# Patient Record
Sex: Female | Born: 1950 | Race: White | Hispanic: No | Marital: Single | Smoking: Current every day smoker
Health system: Southern US, Community
[De-identification: ages and names within clinical notes are randomized; demographics above are authoritative.]

## PROBLEM LIST (undated history)

## (undated) DIAGNOSIS — I10 Essential (primary) hypertension: Secondary | ICD-10-CM

## (undated) DIAGNOSIS — C539 Malignant neoplasm of cervix uteri, unspecified: Secondary | ICD-10-CM

## (undated) DIAGNOSIS — J449 Chronic obstructive pulmonary disease, unspecified: Secondary | ICD-10-CM

## (undated) DIAGNOSIS — I4891 Unspecified atrial fibrillation: Secondary | ICD-10-CM

## (undated) DIAGNOSIS — I509 Heart failure, unspecified: Secondary | ICD-10-CM

## (undated) DIAGNOSIS — E785 Hyperlipidemia, unspecified: Secondary | ICD-10-CM

## (undated) DIAGNOSIS — E079 Disorder of thyroid, unspecified: Secondary | ICD-10-CM

## (undated) HISTORY — DX: Hyperlipidemia, unspecified: E78.5

## (undated) HISTORY — PX: CERVICAL BIOPSY: SHX590

## (undated) HISTORY — DX: Malignant neoplasm of cervix uteri, unspecified: C53.9

## (undated) HISTORY — DX: Chronic obstructive pulmonary disease, unspecified: J44.9

## (undated) HISTORY — DX: Heart failure, unspecified: I50.9

## (undated) HISTORY — DX: Disorder of thyroid, unspecified: E07.9

## (undated) HISTORY — DX: Unspecified atrial fibrillation: I48.91

## (undated) HISTORY — PX: CORNEAL TRANSPLANT: SHX108

---

## 2005-08-07 ENCOUNTER — Ambulatory Visit: Payer: Self-pay | Admitting: Ophthalmology

## 2005-11-20 ENCOUNTER — Ambulatory Visit: Payer: Self-pay | Admitting: Ophthalmology

## 2007-11-20 ENCOUNTER — Ambulatory Visit: Payer: Self-pay | Admitting: Emergency Medicine

## 2013-02-11 ENCOUNTER — Inpatient Hospital Stay: Payer: Self-pay | Admitting: Internal Medicine

## 2013-02-11 LAB — CBC
HCT: 37.9 % (ref 35.0–47.0)
HGB: 12.5 g/dL (ref 12.0–16.0)
MCH: 25.8 pg — ABNORMAL LOW (ref 26.0–34.0)
MCHC: 33 g/dL (ref 32.0–36.0)
Platelet: 153 10*3/uL (ref 150–440)
RBC: 4.85 10*6/uL (ref 3.80–5.20)
RDW: 16.1 % — ABNORMAL HIGH (ref 11.5–14.5)
WBC: 9.2 10*3/uL (ref 3.6–11.0)

## 2013-02-11 LAB — TROPONIN I: Troponin-I: 0.04 ng/mL

## 2013-02-11 LAB — COMPREHENSIVE METABOLIC PANEL
Anion Gap: 8 (ref 7–16)
BUN: 16 mg/dL (ref 7–18)
Co2: 26 mmol/L (ref 21–32)
Creatinine: 0.34 mg/dL — ABNORMAL LOW (ref 0.60–1.30)
Potassium: 4.1 mmol/L (ref 3.5–5.1)
SGPT (ALT): 27 U/L (ref 12–78)
Sodium: 141 mmol/L (ref 136–145)
Total Protein: 6.6 g/dL (ref 6.4–8.2)

## 2013-02-11 LAB — CK TOTAL AND CKMB (NOT AT ARMC)
CK, Total: 62 U/L (ref 21–215)
CK, Total: 54 U/L (ref 21–215)
CK-MB: 3.3 ng/mL (ref 0.5–3.6)
CK-MB: 2.3 ng/mL (ref 0.5–3.6)

## 2013-02-12 LAB — COMPREHENSIVE METABOLIC PANEL
Albumin: 3 g/dL — ABNORMAL LOW (ref 3.4–5.0)
BUN: 19 mg/dL — ABNORMAL HIGH (ref 7–18)
Bilirubin,Total: 0.7 mg/dL (ref 0.2–1.0)
Calcium, Total: 8.7 mg/dL (ref 8.5–10.1)
Chloride: 106 mmol/L (ref 98–107)
Co2: 27 mmol/L (ref 21–32)
EGFR (African American): >60
Glucose: 165 mg/dL — ABNORMAL HIGH (ref 65–99)
SGPT (ALT): 24 U/L (ref 12–78)
Sodium: 139 mmol/L (ref 136–145)
Total Protein: 6.4 g/dL (ref 6.4–8.2)

## 2013-02-12 LAB — CBC WITH DIFFERENTIAL/PLATELET
Basophil #: 0 10*3/uL (ref 0.0–0.1)
Basophil %: 0.4 %
Eosinophil #: 0 10*3/uL (ref 0.0–0.7)
Eosinophil %: 0 %
HCT: 38.2 % (ref 35.0–47.0)
Lymphocyte #: 1.1 10*3/uL (ref 1.0–3.6)
Lymphocyte %: 32.7 %
Monocyte #: 0.1 x10 3/mm — ABNORMAL LOW (ref 0.2–0.9)
Monocyte %: 2 %
Neutrophil #: 2.3 10*3/uL (ref 1.4–6.5)
Neutrophil %: 64.9 %
Platelet: 155 10*3/uL (ref 150–440)
RDW: 16.3 % — ABNORMAL HIGH (ref 11.5–14.5)
WBC: 3.5 10*3/uL — ABNORMAL LOW (ref 3.6–11.0)

## 2013-02-12 LAB — T4, FREE: Free Thyroxine: 4.85 ng/dL — ABNORMAL HIGH (ref 0.76–1.46)

## 2013-02-12 LAB — LIPID PANEL
Ldl Cholesterol, Calc: 42 mg/dL (ref 0–100)
VLDL Cholesterol, Calc: 10 mg/dL (ref 5–40)

## 2013-02-12 LAB — CK TOTAL AND CKMB (NOT AT ARMC)
CK, Total: 42 U/L (ref 21–215)
CK-MB: 2 ng/mL (ref 0.5–3.6)

## 2013-02-12 LAB — TROPONIN I: Troponin-I: 0.02 ng/mL

## 2013-02-13 LAB — DRUG SCREEN, URINE
Benzodiazepine, Ur Scrn: NEGATIVE (ref ?–200)
Cannabinoid 50 Ng, Ur ~~LOC~~: NEGATIVE (ref ?–50)
Cocaine Metabolite,Ur ~~LOC~~: NEGATIVE (ref ?–300)
MDMA (Ecstasy)Ur Screen: NEGATIVE (ref ?–500)
Methadone, Ur Screen: NEGATIVE (ref ?–300)
Opiate, Ur Screen: NEGATIVE (ref ?–300)
Phencyclidine (PCP) Ur S: NEGATIVE (ref ?–25)
Tricyclic, Ur Screen: NEGATIVE (ref ?–1000)

## 2013-02-13 LAB — BASIC METABOLIC PANEL
Anion Gap: 7 (ref 7–16)
BUN: 32 mg/dL — ABNORMAL HIGH (ref 7–18)
Chloride: 103 mmol/L (ref 98–107)
Co2: 26 mmol/L (ref 21–32)
Creatinine: 0.46 mg/dL — ABNORMAL LOW (ref 0.60–1.30)
EGFR (African American): >60
EGFR (Non-African Amer.): >60
Sodium: 136 mmol/L (ref 136–145)

## 2013-02-13 LAB — T4, FREE: Free Thyroxine: 2.65 ng/dL — ABNORMAL HIGH (ref 0.76–1.46)

## 2013-02-16 LAB — CBC WITH DIFFERENTIAL/PLATELET
Eosinophil #: 0 10*3/uL (ref 0.0–0.7)
Eosinophil %: 0.3 %
HCT: 37.9 % (ref 35.0–47.0)
MCH: 25.5 pg — ABNORMAL LOW (ref 26.0–34.0)
MCHC: 32.4 g/dL (ref 32.0–36.0)
MCV: 79 fL — ABNORMAL LOW (ref 80–100)
Monocyte #: 1.4 x10 3/mm — ABNORMAL HIGH (ref 0.2–0.9)
Monocyte %: 11.9 %
Neutrophil #: 7.4 10*3/uL — ABNORMAL HIGH (ref 1.4–6.5)
Neutrophil %: 63.8 %
Platelet: 152 10*3/uL (ref 150–440)
RBC: 4.82 10*6/uL (ref 3.80–5.20)
WBC: 11.7 10*3/uL — ABNORMAL HIGH (ref 3.6–11.0)

## 2013-02-16 LAB — BASIC METABOLIC PANEL
BUN: 24 mg/dL — ABNORMAL HIGH (ref 7–18)
Calcium, Total: 8.5 mg/dL (ref 8.5–10.1)
Co2: 31 mmol/L (ref 21–32)
Potassium: 4.5 mmol/L (ref 3.5–5.1)
Sodium: 139 mmol/L (ref 136–145)

## 2013-02-17 LAB — CULTURE, BLOOD (SINGLE)

## 2013-03-10 ENCOUNTER — Ambulatory Visit: Payer: Self-pay

## 2013-03-12 ENCOUNTER — Ambulatory Visit: Payer: Self-pay

## 2013-12-09 ENCOUNTER — Ambulatory Visit: Payer: Self-pay

## 2013-12-14 ENCOUNTER — Ambulatory Visit: Payer: Self-pay

## 2014-07-12 ENCOUNTER — Observation Stay: Payer: Self-pay | Admitting: Internal Medicine

## 2014-07-12 LAB — CBC WITH DIFFERENTIAL/PLATELET
BASOS PCT: 0.6 %
Basophil #: 0.1 10*3/uL (ref 0.0–0.1)
EOS ABS: 0.1 10*3/uL (ref 0.0–0.7)
Eosinophil %: 1 %
HCT: 39.7 % (ref 35.0–47.0)
HGB: 13.1 g/dL (ref 12.0–16.0)
LYMPHS ABS: 2.1 10*3/uL (ref 1.0–3.6)
LYMPHS PCT: 25.5 %
MCH: 29.3 pg (ref 26.0–34.0)
MCHC: 33 g/dL (ref 32.0–36.0)
MCV: 89 fL (ref 80–100)
MONOS PCT: 12 %
Monocyte #: 1 x10 3/mm — ABNORMAL HIGH (ref 0.2–0.9)
Neutrophil #: 5.1 10*3/uL (ref 1.4–6.5)
Neutrophil %: 60.9 %
PLATELETS: 239 10*3/uL (ref 150–440)
RBC: 4.47 10*6/uL (ref 3.80–5.20)
RDW: 15.4 % — ABNORMAL HIGH (ref 11.5–14.5)
WBC: 8.3 10*3/uL (ref 3.6–11.0)

## 2014-07-12 LAB — URINALYSIS, COMPLETE
Bilirubin,UR: NEGATIVE
Blood: NEGATIVE
GLUCOSE, UR: NEGATIVE mg/dL (ref 0–75)
Hyaline Cast: 46
Ketone: NEGATIVE
LEUKOCYTE ESTERASE: NEGATIVE
NITRITE: NEGATIVE
PROTEIN: NEGATIVE
Ph: 5 (ref 4.5–8.0)
SPECIFIC GRAVITY: 1.013 (ref 1.003–1.030)
WBC UR: 2 /HPF (ref 0–5)

## 2014-07-12 LAB — BASIC METABOLIC PANEL
ANION GAP: 5 — AB (ref 7–16)
BUN: 15 mg/dL (ref 7–18)
CREATININE: 0.81 mg/dL (ref 0.60–1.30)
Calcium, Total: 9 mg/dL (ref 8.5–10.1)
Chloride: 106 mmol/L (ref 98–107)
Co2: 25 mmol/L (ref 21–32)
GLUCOSE: 97 mg/dL (ref 65–99)
Osmolality: 273 (ref 275–301)
POTASSIUM: 4.8 mmol/L (ref 3.5–5.1)
Sodium: 136 mmol/L (ref 136–145)

## 2014-07-12 LAB — TROPONIN I: Troponin-I: <0.02 ng/mL

## 2014-07-12 LAB — T4, FREE: Free Thyroxine: 3.25 ng/dL — ABNORMAL HIGH (ref 0.76–1.46)

## 2014-07-12 LAB — TSH: THYROID STIMULATING HORM: 0.041 u[IU]/mL — AB

## 2014-07-13 LAB — CBC WITH DIFFERENTIAL/PLATELET
BASOS ABS: 0 10*3/uL (ref 0.0–0.1)
Basophil %: 0.6 %
EOS PCT: 1.6 %
Eosinophil #: 0.1 10*3/uL (ref 0.0–0.7)
HCT: 34.2 % — ABNORMAL LOW (ref 35.0–47.0)
HGB: 11.7 g/dL — ABNORMAL LOW (ref 12.0–16.0)
Lymphocyte #: 2.7 10*3/uL (ref 1.0–3.6)
Lymphocyte %: 45.9 %
MCH: 30.5 pg (ref 26.0–34.0)
MCHC: 34.1 g/dL (ref 32.0–36.0)
MCV: 89 fL (ref 80–100)
MONOS PCT: 11.9 %
Monocyte #: 0.7 x10 3/mm (ref 0.2–0.9)
Neutrophil #: 2.4 10*3/uL (ref 1.4–6.5)
Neutrophil %: 40 %
Platelet: 191 10*3/uL (ref 150–440)
RBC: 3.82 10*6/uL (ref 3.80–5.20)
RDW: 15.6 % — AB (ref 11.5–14.5)
WBC: 6 10*3/uL (ref 3.6–11.0)

## 2014-07-13 LAB — BASIC METABOLIC PANEL
ANION GAP: 4 — AB (ref 7–16)
BUN: 13 mg/dL (ref 7–18)
CALCIUM: 8.4 mg/dL — AB (ref 8.5–10.1)
CO2: 25 mmol/L (ref 21–32)
CREATININE: 0.63 mg/dL (ref 0.60–1.30)
Chloride: 111 mmol/L — ABNORMAL HIGH (ref 98–107)
EGFR (Non-African Amer.): >60
GLUCOSE: 93 mg/dL (ref 65–99)
Osmolality: 279 (ref 275–301)
POTASSIUM: 4.3 mmol/L (ref 3.5–5.1)
Sodium: 140 mmol/L (ref 136–145)

## 2014-08-15 ENCOUNTER — Ambulatory Visit: Payer: Self-pay | Admitting: Family

## 2014-09-26 ENCOUNTER — Ambulatory Visit: Payer: Self-pay | Admitting: Family

## 2015-01-27 NOTE — Consult Note (Signed)
Psychiatry: Followup for this 64 year old woman with a history of schizophrenia. No new complaints today. She says that she is feeling better. She is anxious about her cats which is exactly what her parents had been worried about. No indication of any side effects from medicine. I would not try increasing the dose of the Abilify right now. Continue current dosage. Educational therapy done. No indication of acute dangerousness  Electronic Signatures: Deanna Boehlke, Madie Reno (MD)  (Signed on 10-May-14 23:09)  Authored  Last Updated: 10-May-14 23:09 by Gonzella Lex (MD)

## 2015-01-27 NOTE — Consult Note (Signed)
Chief Complaint and History:  Referring Physician Dr. Vianne Bulls   Chief Complaint Hyperthyroidism   Allergies:  Other- Explain in Comments Line: Rash  Tetracycline: Rash  Assessment/Plan:  Assessment/Plan 64 yo F admitted 02/11/13 with dyspnea and found to have rapid a.fib and COPD exacerbation in setting of low TSH and elevated free T4. No known h/o thyroid disease. No exposure to medications assoc with hyperthyroidism. No neck pain/fever. No recent weight loss. Now HR controlled on beta blockers.  On exam she has a goiter and a tremor. Additional labs include elev TRAb and nml free T3.  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  Recheck free T4 in 2 days. Long-term could consider I-131 ablation, however would prefer to control hyperthyroidism reasonably first before giving I-131 to avoid provoking storm. Advised smoking cessation.  Full consult to be dictated.   Electronic Signatures: Judi Cong (MD)  (Signed 12-May-14 16:41)  Authored: Chief Complaint and History, ALLERGIES, HOME MEDICATIONS, Assessment/Plan   Last Updated: 12-May-14 16:41 by Judi Cong (MD)

## 2015-01-27 NOTE — Discharge Summary (Signed)
PATIENT NAME:  Rhonda Barr, Rhonda Barr MR#:  [id number] DATE OF BIRTH:  [birth year]  DATE OF ADMISSION:  02/11/2013 DATE OF DISCHARGE:  02/16/2013  PRIMARY CARE PHYSICIAN: None.   DISCHARGE DIAGNOSES:  1.  Acute on chronic congestive heart failure with ejection fraction of 25% to 30%.  2.  Acute chronic obstructive pulmonary disease exacerbation.  3.  Atrial fibrillation with rapid ventricular rate.  4.  Acute pulmonary edema.  5.  Tobacco abuse.  6.  Alcohol abuse.   IMAGING STUDIES: Include a chest x-ray which showed pulmonary edema. No acute infiltrates. A 2-D echocardiogram showed severe MR and ejection fraction of 25% to 30%.   CONSULTANTS: Dr. Nehemiah Massed and Dr. Mortimer Fries.   ADMITTING HISTORY AND PHYSICAL: Please see detailed H and P dictated previously on 02/11/2013. In brief, this 64 year old female patient with tobacco abuse presented to the hospital complaining of shortness of breath. The patient was found to have atrial fibrillation with rapid ventricular rate and acute pulmonary edema and was admitted to the hospitalist service. The patient was admitted to ICU on a Cardizem drip. The patient did get hypotensive on rate control medications, had to be started on dopamine briefly. Later on was transferred to 2A on beta blocker, metoprolol and Cardizem and did well. For anticoagulation with her risk factors, cardiology suggested that patient be on aspirin, which has been continued. The patient had a 2-D echocardiogram, which showed severe mitral regurgitation and ejection fraction of 25% to 30%. The patient diuresed well with IV Lasix, later transitioned to p.o. Lasix along with fluid restriction of less than 2 liters a day. The patient on the day of discharge is saturating 94% on room air at rest and with ambulation. She was on alcohol withdrawal protocol during the hospital stay.   The patient also had tobacco, alcohol abuse. 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 The patient will  be scheduled for a stress test when she follows up with Dr. Nehemiah Massed as outpatient.   Today, the patient is saturating 96% on room air. Lungs have no crackles on exam. The patient has been treated for acute COPD exacerbation and will be on a prednisone taper after discharge.   The patient also had Graves' hyperthyroidism with low TSH and elevated free T3 and T4, for which she has been started on beta blockers and methimazole. Will follow up with Dr. Gabriel Carina of endocrinology in 1 to 2 weeks.   DISCHARGE MEDICATIONS: Include: 1.  Aspirin 81 mg oral once a day.  2.  Tylenol Extra-Strength 2 tablets oral 2 times a day as needed.  3.  Timolol ophthalmic solution 1 drop to right eye 2 times a day.  4.  Metoprolol 50 mg oral 2 times a day.  5.  Spiriva 18 mcg inhaled once a day.  6.  Cardizem 120 mg oral once a day.  7.  Methimazole 10 mg oral 2 times a day.  8.  Omeprazole 20 mg oral once a day.  9.  Advair Diskus 250/50, 1 puff inhaled 2 times a day.  10.  Ventolin HFA 90 mcg inhaled every 4 hours as needed for shortness of breath.  11.  Prednisone 60 mg tapered over 6 days.  12.  Lisinopril 2.5 mg oral once a day.  13.  Lasix 20 mg oral once a day.   DISCHARGE INSTRUCTIONS: The patient has been set up with home health for nursing and social work. For CHF, low-sodium, fluid restricted diet. Activity as tolerated. Follow up  with Dr. Gabriel Carina and Dr. Nehemiah Massed in 1 to 2 weeks. The patient is also being set up with [facility] as her primary care physician.   TIME SPENT: Time spent on day of discharge in discharge activity was 43 minutes.   ____________________________ Leia Alf Shery Waun[place], MD srs:jm D: 02/16/2013 15:09:35 ET T: 02/16/2013 16:48:56 ET JOB#: [id number]  cc: Alveta Heimlich R. Darvin Neighbours, MD, <Dictator> Corey Skains, MD A. Lavone Orn, MD [facility] Neita Carp MD ELECTRONICALLY SIGNED 02/17/2013 13:59

## 2015-01-27 NOTE — Consult Note (Signed)
PATIENT NAME:  Rhonda Barr, Rhonda Barr MR#:  [id number] DATE OF BIRTH:  09-20-1951  DATE OF CONSULTATION:  02/15/2013  REFERRING PHYSICIAN:  Epifanio Lesches, MD  CONSULTING PHYSICIAN:  A. Lavone Orn, MD  CHIEF COMPLAINT: Hyperthyroidism.   HISTORY OF PRESENT ILLNESS: This is a 64 year old female with no known past medical history who presented to the ED on May 8th with dyspnea. She was found to have rapid atrial fibrillation, hypoxemia, tobacco dependence, and low TSH and elevated free T4. She was admitted to the CCU. She was initially placed on diltiazem.  She was treated for probable COPD with inhalers and steroids.  Her heart rate improved on diltiazem. Initial TSH was less than 0.010, and free T4 was elevated at 4.85. Repeat free T4 on May 10th was 2.65, free T3 within normal limits at 3.8, and thyrotropin receptor antibody was elevated at 9.64. The patient has no known history of thyroid disease. She has been having palpitations for the last 2 months. She denies any weight loss. She does report a chronic tremor. She denies sense of heat intolerance. She denies known recent exposure to lithium or amiodarone. There has been no recent exposure to iodinated contrast dye. She denies neck pain or fevers.   PAST MEDICAL HISTORY: 1.  Tobacco dependence.  2.  Corneal transplant.   OUTPATIENT MEDICATIONS: 1.  Prednisolone 1% eye drops.  2.  Timolol 0.5% eye drops.  3.  Aspirin 81 mg daily.   FAMILY HISTORY: A sister has hypothyroidism. She has had other family members who have required thyroid surgery, details not clear.   SOCIAL HISTORY: She is a 1/2 pack per day smoker. She has been smoking for 50 years. No alcohol use. No drug use.   REVIEW OF SYSTEMS:  GENERAL: No weight loss. No fever.  HEENT: Denies recent change in vision. Denies eye pain.  HEENT: Denies a dry mouth. Denies dysphagia.  NECK: She has chronic posterior neck pain.  CARDIAC: She does have palpitations. Denies chest pain.   PULMONARY: She has dyspnea. She has some cough at times.  ABDOMEN: Denies abdominal pain. Reports good appetite.  EXTREMITIES: Denies leg swelling.  SKIN: Denies rash or recent skin changes. Denies pruritus.  ENDOCRINE: Denies heat or cold intolerance.  GENITOURINARY: Denies dysuria or hematuria.   PHYSICAL EXAMINATION: VITAL SIGNS: Height 62.9 inches, weight 122 pounds, BMI 21.7.  Temperature 97.9, pulse 116, respirations 33, blood pressure 143/76, pulse ox 92% on 2 liters nasal cannula.  GENERAL: Well-developed white female in no acute distress.  HEENT: EOMI. No proptosis. No lid lag. No stare. Oropharynx is clear. Mucous membranes moist.  NECK: Supple. Thyroid is diffusely enlarged, nontender. No palpable thyroid nodules. No thyroid bruit.  CARDIAC: Irregular, irregular. No appreciable murmur.  PULMONARY: Decreased breath sounds throughout. No rhonchi.  ABDOMEN: Diffusely soft, nontender.  EXTREMITIES: No edema is present.  NEUROLOGIC: A resting fine tremor is present.  PSYCHIATRIC: Alert and oriented, calm and cooperative.  SKIN: No rash or dermatopathy is present.   LABORATORY DATA: Glucose 143, BUN 32, creatinine 0.46, sodium 136, potassium 4.7, calcium 8.7. Urine drug screen negative.   ASSESSMENT: A 64 year old female admitted for rapid atrial fibrillation and chronic obstructive pulmonary disease exacerbation, found to have hyperthyroidism. Elevated thyrotropin receptor antibody level supports autoimmune hyperthyroidism or Graves' disease as the cause.   RECOMMENDATIONS: 1.  Start methimazole 20 mg b.i.d.  2.  Check free T4 every 2 days. I anticipate decreasing dose once her free T4 normalizes.  3.  Continue  beta blockers as you are for rate control.  4.  Long term, she may benefit from I-131 ablation, however, I would prefer to control hyperthyroidism initially with thionamide and consider ablation at a later date, if she is agreeable.  5.  Recommended smoking cessation.    Thank you for the kind request for consultation. I will follow along with you and anticipate setting up outpatient followup closer to the date of discharge.   ____________________________ A. Lavone Orn, MD ams:cb D: 02/15/2013 16:46:26 ET T: 02/15/2013 21:22:02 ET JOB#: [id number]  cc: A. Lavone Orn, MD, <Dictator> Sherlon Handing MD ELECTRONICALLY SIGNED 02/18/2013 8:20

## 2015-01-27 NOTE — Consult Note (Signed)
Brief Consult Note: Diagnosis: schizophrenia.   Patient was seen by consultant.   Consult note dictated.   Orders entered.   Comments: Psychiatry: Patient seen, chart reviewed, also spoke to patient's mother. Patient is 64 year old woman with long hx of schizophrenia who stopped treatment decades ago but has managed to function outside the hospital despite intermittant symptoms. Currently showing symptoms of disorganized and odd thinking and speech. I suggested to the patient and her family that adding antipsychotics back in a gentle way could be helpful. They agree. Order for 5mg  Abilify daily.   Parents greatest worry is that in her absence they have had the patient's home cleaned and removed 15 feral cats from the home. They are worried that pt will be angry. They ask if staff can help support their claim that the cats were a health hazard to the patient.  Electronic Signatures: Gonzella Lex (MD)  (Signed 09-May-14 18:51)  Authored: Brief Consult Note   Last Updated: 09-May-14 18:51 by Gonzella Lex (MD)

## 2015-01-27 NOTE — Consult Note (Signed)
General Aspect 64 year old female with history of tobacco abuse presented to urgent care and then to [facility] for 3 day history of shortness breath, palpitations, dizziness.   She was found to be in new onset A. fib.  She was Transferred to the unit with A. fib with RVR and is on Cardizem drip as well as by mouth Cardizem 60 mg every 6.  This a.m. her rate is in the 90s.  She does feels better.  Her cardiac enzymes have been negative.  Her TSH suggests hyperthyroidism. She denies   thyroid issues in the past.  She has smoked for 50 years but currently has cut back and is trying to quit.  She was also hypoxic on admission.  Chest x-ray showed right pleural effusion, CT of the chest/ echo is pending. Patient currently is on antibiotics. Also has noticed that she has had some lower extremity edema recently. She has had sinus issues recently has been taking decongestants over-the-counter the last several weeks, which have may also have contributed to the onset of A. fib.   Physical Exam:  GEN well developed, no acute distress   HEENT pink conjunctivae   RESP wheezing  Bilateral Expiratory normal respiratory wheezes    CARD Irregular rate and rhythm  No murmur    ABD denies tenderness  normal BS    EXTR negative edema   SKIN skin turgor decreased   NEURO cranial nerves intact, motor/sensory function intact   PSYCH alert, A+O to time, place, person   Review of Systems:  Subjective/Chief Complaint Shortness of breath, palpitations, Dizziness    Respiratory: Short of breath  Wheezing    Cardiovascular: Palpitations  Dyspnea  Edema    Review of Systems: All other systems were reviewed and found to be negative    Lab Results:  Thyroid:  09-May-14 04:00   Thyroid Stimulating Hormone  < 0.010 (0.45-4.50 (International Unit)  ----------------------- Pregnant patients have  different reference  ranges for TSH:  - - - - - - - - - -  Pregnant, first trimetser:  0.36 - 2.50 uIU/mL)  Hepatic:   09-May-14 04:00   Bilirubin, Total 0.7  Alkaline Phosphatase 122  SGPT (ALT) 24  SGOT (AST) 25  Total Protein, Serum 6.4  Albumin, Serum  3.0  Routine Chem:  09-May-14 04:00   Glucose, Serum  165  BUN  19  Creatinine (comp)  0.34  Sodium, Serum 139  Potassium, Serum 4.6  CO2, Serum 27  Calcium (Total), Serum 8.7  Osmolality (calc) 283  eGFR (African American) >60  eGFR (Non-African American) >60 (eGFR values <25m/min/1.73 m2 may be an indication of chronic kidney disease (CKD). Calculated eGFR is useful in patients with stable renal function. The eGFR calculation will not be reliable in acutely ill patients when serum creatinine is changing rapidly. It is not useful in  patients on dialysis. The eGFR calculation may not be applicable to patients at the low and high extremes of body sizes, pregnant women, and vegetarians.)  Anion Gap  6  Hemoglobin A1c ([facility]) 6.1 (The American Diabetes Association recommends that a primary goal of therapy should be <7% and that physicians should reevaluate the treatment regimen in patients with HbA1c values consistently >8%.)  Cholesterol, Serum 101  Triglycerides, Serum 51  HDL (INHOUSE) 49  VLDL Cholesterol Calculated 10  LDL Cholesterol Calculated 42 (Result(s) reported on 12 Feb 2013 at 04:56AM.)  Cardiac:  09-May-14 04:00   CK, Total 42  CPK-MB, Serum 2.0 (Result(s) reported  on 12 Feb 2013 at 04:51AM.)  Troponin I 0.02 (0.00-0.05 0.05 ng/mL or less: NEGATIVE  Repeat testing in 3-6 hrs  if clinically indicated. >0.05 ng/mL: POTENTIAL  MYOCARDIAL INJURY. Repeat  testing in 3-6 hrs if  clinically indicated. NOTE: An increase or decrease  of 30% or more on serial  testing suggests a  clinically important change)  Routine Hem:  09-May-14 04:00   WBC (CBC)  3.5  RBC (CBC) 4.91  Hemoglobin (CBC) 12.6  Hematocrit (CBC) 38.2  Platelet Count (CBC) 155  MCV  78  MCH  25.6  MCHC 32.9  RDW  16.3  Neutrophil % 64.9  Lymphocyte  % 32.7  Monocyte % 2.0  Eosinophil % 0.0  Basophil % 0.4  Neutrophil # 2.3  Lymphocyte # 1.1  Monocyte #  0.1  Eosinophil # 0.0  Basophil # 0.0 (Result(s) reported on 12 Feb 2013 at 04:35AM.)   Radiology Results:  XRay:    08-May-14 12:41, Chest Portable Single View  Chest Portable Single View   REASON FOR EXAM:    shob  COMMENTS:       PROCEDURE: DXR - DXR PORTABLE CHEST SINGLE VIEW  - Feb 11 2013 12:41PM     RESULT: The heart is enlarged. There there is evidence of a right pleural   effusion. There is pulmonary vascular congestion. Followup PA and lateral   views are recommended. Monitoring electrodes are in place.    IMPRESSION:  Findings consistent with a right pleural effusion and   cardiomegaly.    Dictation Site: 2      Verified By: Sundra Aland, M.D., MD  CT:    08-May-14 15:35, CT Chest With Contrast  CT Chest With Contrast   REASON FOR EXAM:    sob with   pleural effusion  COMMENTS:       PROCEDURE: CT  - CT CHEST WITH CONTRAST  - Feb 11 2013  3:35PM     RESULT: Indication: Shortness of breath, pleural effusion    Comparison: None    Technique: Multiple axial images of the chest are obtained with 75 mL of   Isovue-370 intravenous contrast.    Findings:    The central airways are patent. There is a small-moderate right pleural     effusion. There is a trace left pleural effusion. There is a partially   loculated component of the right pleural effusion. There is right basilar   and middle lobe compressive atelectasis. There is no pneumothorax.    There are no pathologically enlarged axillary, hilar, or mediastinal   lymph nodes.      The heart size is enlarged. There is no pericardial effusion. The   thoracic aorta is normal in caliber.  There is coronary artery   atherosclerosis of the LAD. There is enlargement of the main pulmonary   artery.    Review of bone windows demonstrates no focal lytic or sclerotic lesions.     Limited  noncontrast images of the upper abdomen were obtained. The     adrenal glands appear normal. There is a small amount of upper abdominal   ascites.    IMPRESSION:     1. Small-moderate right pleural effusion with a partially loculated   component.     2. Trace left pleural effusion.    3. Small amount of abdominal ascites.    4. Coronary artery disease.    Dictation Site: 1    Verified By: Jennette Banker, M.D., MD  Other- Explain in Comments Line: Rash  Tetracycline: Rash  Vital Signs/Nurse's Notes:  **Vital Signs.:   09-May-14 08:10  Temperature Source oral  Pulse Pulse 116  Respirations Respirations 33  Systolic BP Systolic BP 390  Diastolic BP (mmHg) Diastolic BP (mmHg) 74  Mean BP 85  Pulse Ox % Pulse Ox % 94  Oxygen Delivery 2L  Pulse Ox Heart Rate 58    Impression 1. New onset A. fib with RVR-  continue Cardizem by mouth, long-acting with short acting, as  ordered by Dr. Nehemiah Massed,  off drip.  Continue Lovenox.  Patient currently with a chads2 score of 1 primarily due to recent symptoms of heart failure.  Echocardiogram pending which will indicate  LV dysfunction or   heart failure symptoms secondary to rapid rate exacerbated by COPD, with possibly a thyroid component.   Electronic Signatures: Roderic Palau (NP)  (Signed [phone] 09:23)  Authored: General Aspect/Present Illness, History and Physical Exam, Review of System, Home Medications, Labs, Radiology, Allergies, Vital Signs/Nurse's Notes, Impression/Plan   Last Updated: 09-May-14 09:23 by Roderic Palau (NP)

## 2015-01-27 NOTE — Consult Note (Signed)
PATIENT NAME:  Rhonda Barr, Rhonda Barr MR#:  [id number] DATE OF BIRTH:  15-Jul-1951  DATE OF CONSULTATION:  02/12/2013  REFERRING PHYSICIAN:   CONSULTING PHYSICIAN:  Gonzella Lex, MD  IDENTIFYING INFORMATION AND REASON FOR CONSULTATION:  A 64 year old woman who was admitted to the hospital for what appears to probably be atrial fibrillation related to or causing symptoms of heart failure.  The consultation was because of a history of schizophrenia with request for treatment recommendations.   HISTORY OF PRESENT ILLNESS:  Information obtained from the patient and the chart.  The patient tells a fairly coherent story of recent physical symptoms which led her to go to the health department seeking treatment.  She was having trouble with shortness of breath and thought that it was an allergy so she went to the health department seeking a "allergy shot."  Examination there led to a more correct diagnosis and she was referred to the hospital.  In terms of her mental health the patient denies any acute symptoms.  She states that her mood is feeling quite good.  She says that she sleeps well at night and in fact perhaps sleeps more than is necessary.  Her appetite is fine.  She absolutely denies any suicidal ideation.  She is not aware of having any psychiatric symptoms that she will admit to.  As our conversation progressed, I was able to ask her whether she had any symptoms of hallucinations.  The patient never did give me a straight answer to that question, but some of things that she did say led me to believe that it is possible that she may have hallucinations at time.  The parents state that the patient generally functions pretty well despite her mental illness.  She lives by herself and takes care of her own bills.  She received some help from her parents and her sister, but for the most part has been reasonably independent.  The patient does not seem to be any trouble to her neighbors or the environment around her.   They say that she does exhibit psychotic symptoms from time to time and they will vary depending on the stress she is under and her current situation.  Parents speculate that worsening symptoms at the time now may be related to her medical stress.  The patient is not taking any psychiatric medicine or receiving any psychiatric treatment in the community.   PAST PSYCHIATRIC HISTORY:  The patient and parents both report that she had psychiatric symptoms many years ago back in the 1970s.  She was seen for some time by a psychiatrist and receive psychiatric medicine, but discontinued it decades ago, probably in the 61s.  Since that time she has received no psychiatric treatment, but has functioned adequately.  She has not had psychiatric hospitalizations.  No history of suicide attempts.  No history of violence to others.  Neither the patient nor her family remember specific medication she was on in the past.   SUBSTANCE ABUSE HISTORY:  No current or past history of alcohol or drug abuse.   PAST MEDICAL HISTORY:  The patient had eye surgery recently, but otherwise seems to not receive much medical care or have much of a known medical history.    MEDICATIONS:  She says she is on no medications at home.   FAMILY HISTORY:  None stated.   SOCIAL HISTORY:  The patient does receive social security disability.  She lives in a home and her parents live nearby.  The patient  takes care of her own bills and shopping.  Until recently she was driving although since her eye surgery she has cut down or discontinued that.  She was married decades ago, but has no children.   REVIEW OF SYSTEMS:  The patient does not have any psychiatric complaints currently.   MENTAL STATUS EXAMINATION:  The patient was awake and alert and quite pleasant and cooperative during the interview.  Eye contact was intermittent.  As she got agitated she would dart or glance around the room.  Her speech was normal in tone.  It became somewhat  erratic in its rate when she would get anxious.  Her affect varied from pleasant and smiling to tearful on a couple of occasions.  Mood was stated as feeling good.  Her thoughts were scattered and disorganized.  This varied from time to time.  Sticking to concrete facts and recent history she was able to give a pretty clear history and hold a reasonable conversation.  When things strayed into more distant history or more open-ended questions she would become disorganized and start making bizarre statements or disconnected statements.  She denies any suicidal or homicidal ideation.  She would not directly answer questions about hallucinations.  Insight into her mental illness is partial at best.  Judgment overall seems to have been reasonable.  Intelligence normal.   ASSESSMENT:  This is a 64 year old woman who is in the hospital for a heart condition and it is serendipitously noted that she is psychotic.  The patient evidently has been refusing treatment and has been maintaining stability in her life despite intermittent psychotic symptoms for decades.  Given all of that, there may be little to gain from aggressive treatment at this point.  Nevertheless, it is probably worthwhile to at least investigate whether gentle psychiatric treatment could improve anything in her life.  I do not think there is any indication of acute dangerousness.  The patient does appear to understand her current condition well enough to make reasonable treatment decisions.   TREATMENT PLAN:  I suggested to the patient and her family that starting with low doses of an antipsychotic such as Abilify may be of some benefit.  They were agreeable to this.  I would start with 5 mg a day.  Given that the chances of it making a real difference are only partial, we certainly would want to avoid causing any unnecessary side effects.  I also noticed that the patient's labs suggest that she is very hyperthyroid.  It is possible that that may also be  contributing to some of her problems, not only with her atrial fibrillation, but with her mental health issues.  It may be that once that is treated that she will become psychiatrically more stable.   RECOMMENDATIONS:  Abilify 5 mg by mouth daily.  We will follow up.  No indication for any other change to treatment.  One additional note, the patient's parents report that they have gone into her house in her absence and had it cleaned.  In the process they removed 15 feral cats from the house.  They know that the patient is probably going to be upset about this when she comes home.  They request that we support their intention to tell her gently that the cats were bad for her health.  I think that is a perfectly reasonable way to phrase it.   DIAGNOSIS, PRINCIPAL AND PRIMARY:  AXIS I:  Schizophrenia, undifferentiated.   SECONDARY DIAGNOSES: AXIS I:  No  further.  AXIS II:  No diagnosis.  AXIS III:  Multiple medical problems as noted in the primary notes.  AXIS IV:  Moderate stress simply from the burden of her acute illness.  AXIS V:  Functioning at time of evaluation 67.     ____________________________ Gonzella Lex, MD jtc:ea D: 02/12/2013 19:02:41 ET T: 02/13/2013 01:36:05 ET JOB#: [id number]  cc: Gonzella Lex, MD, <Dictator> Gonzella Lex MD ELECTRONICALLY SIGNED 02/14/2013 21:50

## 2015-01-27 NOTE — H&P (Signed)
PATIENT NAME:  Rhonda Barr, Rhonda Barr MR#:  [id number] DATE OF BIRTH:  1951/02/27  DATE OF ADMISSION:  02/11/2013  PRIMARY CARE PHYSICIAN: None.  ER PHYSICIAN: Arman Filter, MD    CHIEF COMPLAINT: Shortness of breath.   HISTORY OF PRESENT ILLNESS: The patient is a 64 year old female patient with a history of heavy smoking, went to urgent care in [facility] for shortness of breath evaluation. The patient thought she had allergy, and she wanted an allergy shot, but there she was found to have a-fib with heart rate of 170 beats per minute, so the patient was brought into the ER by EMS. EMS gave a total of 25 mg Cardizem IV push, and the patient also was found to be hypoxic with 90% sats on room air.   We are asked to admit the patient for new onset a-fib with RVR. The patient was seen at bedside, says that she is having shortness of breath for almost 1 month associated with pedal edema, orthopnea. The patient started to have palpitations almost for a month and then denies any cough, but she says she does have some occasional wheezing, and the patient also is feeling dizzy for the past 3 days.   So, her main complaint is shortness of breath, palpitations, and feeling dizzy since 3 days.   PAST MEDICAL HISTORY:  Significant for history of corneal transplant.   ALLERGIES: SHE IS ALLERGIC TO TETRACYCLINES.   PAST SURGICAL HISTORY: Significant for corneal transplant in 2013   MEDICATIONS: She takes prednisolone 1% eye drop in the right eye once a day and timolol 0.5% 1 drop in the right eye 2 times a day, and she takes Tylenol as needed for pain and aspirin 81 mg daily.   FAMILY HISTORY: No hypertension or diabetes.   SOCIAL HISTORY: Smokes 1/2 pack per day, previously was a heavy smoker for a long time, now in the process of quitting. The patient smoked for about 50 years. Occasional alcohol. No drugs. Not working anymore.    REVIEW OF SYSTEMS:  CONSTITUTIONAL: Feels fatigued. No fever, no weight loss.   EYES: No blurred vision.  ENT: No tinnitus. No epistaxis. No difficulty swallowing.  RESPIRATIONS:  The patient has shortness of breath for a month. No hemoptysis.  CARDIOVASCULAR: Had heaviness in the chest and palpitations with orthopnea and pedal edema for one month.  GASTROINTESTINAL: No nausea, no vomiting. No diarrhea. No abdominal pain.  GENITOURINARY: No dysuria.  ENDOCRINE: No polyuria or nocturia.  INTEGUMENTARY: No skin rashes.  MUSCULOSKELETAL: No joint pains.  NEUROLOGIC: No numbness or weakness.  PSYCHIATRIC: No anxiety or insomnia.   PHYSICAL EXAMINATION: VITAL SIGNS: Temperature 98, heart rate in the ER by EMS it was 170.  When I saw the patient, heart rate was varying between 120 to 130 in atrial fibrillation. Blood pressure 124/85, sats 90% on room air. She is on 2 liters, saturating 95%.  GENERAL: Alert, awake, oriented slightly cachectic female, not in distress, answering questions appropriately.  HEENT: Head atraumatic, normocephalic. Pupils are equal and reacting to light. Extraocular movements intact.  ENT: No tympanic membrane congestion. No turbinate hypertrophy. No oropharyngeal erythema.  NECK: Normal range of motion. No JVD. No carotid bruit.  CARDIOVASCULAR: S1, S2 irregularly irregular.  LUNGS: Bilateral expiratory wheeze present in all lung fields.  ABDOMEN: Soft, nontender, nondistended. No hernias. The patient's bowel sounds are present.  EXTREMITIES: The patient does have 1+ pitting edema bilaterally.  NEUROLOGICAL: Awake, alert and oriented. Cranial nerves II through XII are intact.  Power 5 out of 5 in upper and lower extremities. Sensations are intact. Deep tendon reflexes 2+ bilaterally.  PSYCHIATRIC: Mood and affect are within normal limits.   LABORATORY AND RADIOLOGICAL DATA:  Chest x-ray: Findings consistent with a right pleural effusion and cardiomegaly.   WBC 9.0, hemoglobin 12.3, hematocrit 37.9, platelets 153. Electrolytes: Sodium 141,  potassium 4.1, chloride 107, bicarbonate 26, BUN is 16, creatinine 0.34, glucose 89. Liver functions within normal limits. CPK-MB 3.3. CK total 62, troponin 0.04.   EKG shows atrial fibrillation with RVR 118 beats per minute.   ASSESSMENT AND PLAN: A 64 year old female patient with: 1.  New onset a-fib with RVR probably secondary to underlying respiratory distress:  The patient is going to be admitted to 2A.  The heart rate right now is overall well controlled between 118 to 120, so I am going to try Cardizem 30 every 6 hours, and full-dose Lovenox and obtain cardiology consult with Dr. Clayborn Bigness.  If the  heart rate is still uncontrolled, she will be moved to the CCU and started on Cardizem drip. The patient will get an echocardiogram and cardiology consult.  We will also get TSH levels.   2.  Chest pain and shortness of breath and pedal edema:  We will have to evaluate for CHF.  The patient's BNP  will be added to the blood work and continue to follow echocardiogram, add a small dose of Lasix 20 mg daily.  3.  Hypoxia with heavy history of smoking:  The patient does have some wheezing. She started on Duo-Neb, Spiriva and also will continue Advair.  Obtain  CT chest with contrast to evaluate for right pleural effusion for possible underlying malignancy.  4.  Tobacco abuse:  Counseled for 3 to 10 minutes, and the patient is in the process of quitting.  5.  History of corneal transplant:  The patient is on prednisolone eye drops along with timolol.  We will continue that. 6.  Gastrointestinal  prophylaxis with p.o. Prilosec.   I discussed the plan with the patient's mother and father.   TIME SPENT ON HISTORY AND PHYSICAL: About 55 minutes.   ____________________________ Epifanio Lesches, MD sk:cb D: 02/11/2013 15:17:12 ET T: 02/11/2013 15:34:52 ET JOB#: [id number]  cc: Epifanio Lesches, MD, <Dictator> Epifanio Lesches MD ELECTRONICALLY SIGNED 02/19/2013 19:00

## 2015-01-28 NOTE — H&P (Signed)
PATIENT NAME:  Rhonda Barr, Rhonda Barr MR#:  [id number] DATE OF BIRTH:  07-12-1951  DATE OF ADMISSION:  07/12/2014  PRIMARY CARE PHYSICIAN:  Glendon Axe, MD (with [facility])   REFERRING EMERGENCY ROOM PHYSICIAN:  Verdia Kuba. Paduchowski, MD   CHIEF COMPLAINT:  Atrial fibrillation with RVR.   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 She reports that she has been in her normal state of health. Her home health nurse came today to check vitals and had difficulty obtaining a blood pressure and heart rate. She was asked to present to the Emergency Room for further evaluation. On presentation, her initial heart rate was in the 115 to 130 range, and her blood pressure was 94/63. Hospitalist service was asked to admit for atrial fibrillation with RVR and hypotension.   PAST MEDICAL HISTORY: 1.  Corneal transplant.  2.  Hyperthyroidism due to Graves' disease status post radioiodine ablation x 2 followed by Dr. Gabriel Carina.  3.  Cardiomyopathy with ejection fraction of 25 to 30%.  4.  COPD.  5.  Atrial fibrillation.  6.  Ongoing tobacco abuse.   SOCIAL HISTORY:  The patient reports that she lives by herself. She does have a home health nurse who visits her about once a month to check vitals. She smokes 3/4 pack of cigarettes per day for the past 40 years. She does have a history of heavy alcohol consumption, but recently she has not been drinking at all. She is disabled, not working. Her mother lives nearby and participates in her care. She does not have a power of attorney, decision-maker proxy or living will.   FAMILY MEDICAL HISTORY:  Negative for hypertension or diabetes.    ALLERGIES:  TETRACYCLINES, WHICH CAUSE RASH.   HOME MEDICATIONS: 1.  Spiriva 18 mcg inhalation 1 inhalation daily.  2.  Omeprazole 20 mg 1 capsule daily.  3.  Metoprolol tartrate 50 mg 1 tablet twice a day.  4.  Lisinopril 2.5 mg 1 tablet once a day.  5.  Furosemide 20 mg 1 tablet daily.   REVIEW OF SYSTEMS: GENERAL:  Positive for a 5-pound weight loss. Positive for fatigue.  No fevers or chills.  HEENT:  No change in hearing or vision. No pain in the eyes or ears. No difficulty swallowing or sore throat.  RESPIRATORY:  No shortness of breath, wheezing, coughing, or hemoptysis.  CARDIOVASCULAR:  No noted palpitations, chest pain, syncope, or edema.  GENITOURINARY:  Negative for frequency or dysuria.  GASTROINTESTINAL:  No nausea, vomiting, diarrhea, or abdominal pain.  MUSCULOSKELETAL:  No tender or swollen joints. No recent trauma. No myalgias or arthralgias.  NEUROLOGIC:  No focal numbness or weakness. No headache. No seizure. No confusion.  PSYCHIATRIC:  No recent change in baseline depression or anxiety.   PHYSICAL EXAMINATION: VITAL SIGNS:  Temperature 97.5, pulse 96, respirations 18, blood pressure 100/67, oxygenation 97% on room air.  GENERAL:  No acute distress.  HEENT:  The right cornea is white and fairly opaque. The left pupil is reactive. Extraocular motion is intact. Conjunctivae are clear. Oral mucous membranes are dry. Fair dentition. Posterior oropharynx is clear with no exudate or edema.  NECK:  No cervical lymphadenopathy. Thyroid is not palpable. Nontender. No nodules.  RESPIRATORY:  Lungs have bibasilar crackles. Good air movement. No respiratory distress.  CARDIOVASCULAR:  Irregularly irregular, rate controlled. No murmurs, rubs, or gallops. No peripheral edema. Peripheral pulses are 2+.  ABDOMEN:  Soft, nontender, nondistended. Bowel sounds are normal.  MUSCULOSKELETAL:  There are no swollen or tender joints. Range of motion is normal. Strength is 4/4 throughout.   NEUROLOGIC:  Cranial nerves II through XII are grossly intact with the exception of loss of vision in the right eye. Strength and sensation are intact. Nonfocal neurologic examination.  PSYCHIATRIC:  The patient is very talkative. She is oriented. She does get confused in conversation and makes several inappropriate statements.   LABORATORY DATA:  Sodium is 136, potassium 4.8, chloride 106, bicarbonate 25, BUN 15, creatinine 0.81, glucose 97. Troponin is negative at 0.02. TSH is 0.041. Free T4 is 3.25. White blood cell count is 8.3, hemoglobin 13.1, platelets 236, and MCV is 89. Urinalysis is negative for signs of infection.   IMAGING:  Chest x-ray shows underlying emphysematous change. No edema or consolidation.   ASSESSMENT AND PLAN: 1.  Atrial fibrillation with rapid ventricular rate with initial rate in the 115 to 130 range. Rate is now controlled with digoxin 0.25 mg IV push and 1 liter of normal saline. Exacerbation of atrial fibrillation is most likely due to hyperthyroidism. TSH is once again low with an elevated T4. I have consulted endocrinology, as she has already had 2 radioactive iodine treatments and is not currently on methimazole. We will continue with IV fluids. If blood pressure is stable, would increase metoprolol from 50 to 100 mg b.i.d. I have discussed the case with her cardiologist, Dr. Nehemiah Massed, who will see her tomorrow. For now, we will continue with aspirin for anticoagulation. She may be a good candidate for a new anticoagulant.  2.  Systolic heart failure with ejection fraction of 25 to 30%:  No sign at this time of volume overload. Monitor carefully with IV fluid hydration.  3.  Chronic obstructive pulmonary disease:  Continue home regimen. No sign of current exacerbation.  4.  Ongoing tobacco abuse:  Smoking cessation counseling was provided today by me. She does not seem interested in quitting smoking.  5.  History of alcohol abuse:  She reports that she is not currently  drinking alcohol. She shows no signs of current withdrawal. Monitor closely in case we need to initiate CIWA-Ar protocol.   TIME SPENT ON ADMISSION:  45 minutes.    ____________________________ Earleen Newport. Volanda Napoleon, MD cpw:nb D: 07/12/2014 22:29:08 ET T: 07/12/2014 23:13:01 ET JOB#: [id number]  cc: Barnetta Chapel P. Volanda Napoleon, MD, <Dictator> Aldean Jewett MD ELECTRONICALLY SIGNED 07/13/2014 13:37

## 2015-01-28 NOTE — Consult Note (Signed)
PATIENT NAME:  Rhonda Barr, Rhonda Barr MR#:  [id number] DATE OF BIRTH:  24-Oct-1950  DATE OF CONSULTATION:  07/12/2014  REFERRING PHYSICIAN:   CONSULTING PHYSICIAN:  Corey Skains, MD  CONSULTING PHYSICIAN:  Dr. Lavetta Nielsen.   REASON FOR CONSULTATION: Acute on chronic systolic dysfunction, congestive heart failure with paroxysmal atrial fibrillation.   CHIEF COMPLAINT:  Had palpitations and low blood pressure.     HISTORY OF PRESENT ILLNESS: This is a 64 year old female with known chronic systolic dysfunction heart failure, who has had new onset of palpitations, irregular heartbeat, shortness of breath, weakness, and fatigue.  When seen in the Emergency Room she had paroxysmal nonvalvular atrial fibrillation with rapid ventricular rate with a lower blood pressure but not critically hypotensive. She also had shortness of breath consistent with possible acute on chronic systolic dysfunction heart failure with a previous ejection fraction of 25%. She has normal troponin without evidence of myocardial infarction, although has been on ACE inhibitor which could contribute to some of her hypotension. She currently has had no evidence of chest discomfort at this time and is feeling better with oxygenation.   REVIEW OF SYSTEMS:  Remainder of review of systems negative for vision change, ringing in the ears, hearing loss, cough, congestion, heartburn, nausea, vomiting, diarrhea, bloody stools, stomach pain, extremity pain, leg weakness, cramping of the buttocks, known blood clots, headaches, nosebleeds, congestion, trouble swallowing, frequent urination, urination at night, muscle weakness, numbness, anxiety, depression, skin lesions, skin rashes.   PAST MEDICAL HISTORY:  1.  Atrial fibrillation paroxysmal.   2.  Chronic systolic dysfunction heart failure.  3.  Hypertension.   FAMILY HISTORY:  No apparent family history of cardiovascular disease.   SOCIAL HISTORY:  She smokes a pack per day. Does not now drink  alcohol.   ALLERGIES: AS LISTED.   MEDICATIONS: As listed.   PHYSICAL EXAMINATION:  VITAL SIGNS: Blood pressure 100/68 bilaterally, Heart rate is 96 upright, reclining, and irregular.  GENERAL: She is a well-appearing female in no acute distress.  HEAD, EYES, EARS, NOSE, AND THROAT: No icterus, thyromegaly, ulcers, hemorrhage, or xanthelasma.  CARDIOVASCULAR: Irregularly irregular with normal S1, S2 with a 2 out of 6 apical murmur consistent with mitral regurgitation. PMI is inferiorly displaced. Carotid upstroke normal without bruit. Jugular venous pressure is normal.  LUNGS: A few basilar crackles with normal respirations.  ABDOMEN: Soft, nontender, without hepatosplenomegaly or masses. Abdominal aorta is normal size without bruit.  EXTREMITIES: 2 +  radial, 1 + femoral, trace dorsal pedal pulses, with no lower extremity edema, cyanosis, clubbing. or ulcers.  NEUROLOGIC: She is oriented to time, place, and person, with normal mood and affect.   ASSESSMENT: A 64 year old female with acute on chronic systolic dysfunction, congestive heart failure, without evidence of myocardial infarction with new onset of nonvalvular paroxysmal atrial fibrillation with rapid ventricular rate.   RECOMMENDATIONS:  1.  Metoprolol for congestive heart failure, LV systolic dysfunction, and heart rate control, increasing dose for a goal heart rate of 60-90 beats per minute.  2.  Anticoagulation for further risk reduction in stroke with atrial fibrillation.  3.  Discontinuation of ACE inhibitor due to concerns of hypotension while working with the above.  4.  No further cardiac diagnostics at this time unless further symptoms occur with adjustments of medications.  5.  Ambulation and further adjustments thereafter.    ____________________________ Corey Skains, MD bjk:bu D: 07/12/2014 17:05:54 ET T: 07/12/2014 17:57:05 ET JOB#: [id number]  cc: Corey Skains, MD, <Dictator> Everlean Cherry  Nehemiah Massed  MD ELECTRONICALLY SIGNED 07/13/2014 18:03

## 2015-02-23 DIAGNOSIS — Z720 Tobacco use: Secondary | ICD-10-CM | POA: Insufficient documentation

## 2015-02-23 DIAGNOSIS — I5022 Chronic systolic (congestive) heart failure: Secondary | ICD-10-CM

## 2015-02-23 DIAGNOSIS — I959 Hypotension, unspecified: Secondary | ICD-10-CM | POA: Insufficient documentation

## 2015-02-23 DIAGNOSIS — I950 Idiopathic hypotension: Secondary | ICD-10-CM

## 2016-03-11 ENCOUNTER — Other Ambulatory Visit: Payer: Self-pay | Admitting: Internal Medicine

## 2016-03-11 DIAGNOSIS — E0500 Thyrotoxicosis with diffuse goiter without thyrotoxic crisis or storm: Secondary | ICD-10-CM

## 2016-04-02 ENCOUNTER — Encounter
Admission: RE | Admit: 2016-04-02 | Discharge: 2016-04-02 | Disposition: A | Discharge status: Home / Self Care | Payer: Medicare Other | Source: Ambulatory Visit | Attending: Internal Medicine | Admitting: Internal Medicine

## 2016-04-02 ENCOUNTER — Ambulatory Visit
Admission: RE | Admit: 2016-04-02 | Discharge: 2016-04-02 | Disposition: A | Discharge status: Home / Self Care | Payer: Medicare Other | Source: Ambulatory Visit | Attending: Internal Medicine | Admitting: Internal Medicine

## 2016-04-02 DIAGNOSIS — E0500 Thyrotoxicosis with diffuse goiter without thyrotoxic crisis or storm: Secondary | ICD-10-CM | POA: Insufficient documentation

## 2016-04-02 MED ORDER — SODIUM IODIDE I-123 3.7 MBQ PO CAPS
165.1000 | ORAL_CAPSULE | Freq: Once | ORAL | Status: AC
  Administered 2016-04-02: 165.1 via ORAL

## 2016-04-03 ENCOUNTER — Encounter
Admission: RE | Admit: 2016-04-03 | Discharge: 2016-04-03 | Disposition: A | Discharge status: Home / Self Care | Payer: Medicare Other | Source: Ambulatory Visit | Attending: Internal Medicine | Admitting: Internal Medicine

## 2016-04-03 DIAGNOSIS — E0500 Thyrotoxicosis with diffuse goiter without thyrotoxic crisis or storm: Secondary | ICD-10-CM | POA: Diagnosis not present

## 2016-04-05 ENCOUNTER — Encounter
Admission: RE | Admit: 2016-04-05 | Discharge: 2016-04-05 | Disposition: A | Discharge status: Home / Self Care | Payer: Medicare Other | Source: Ambulatory Visit | Attending: Internal Medicine | Admitting: Internal Medicine

## 2016-04-05 DIAGNOSIS — E0500 Thyrotoxicosis with diffuse goiter without thyrotoxic crisis or storm: Secondary | ICD-10-CM | POA: Insufficient documentation

## 2016-04-05 MED ORDER — SODIUM IODIDE I 131 CAPSULE
25.4600 | Freq: Once | INTRAVENOUS | Status: AC | PRN
  Administered 2016-04-05: 25.46 via ORAL

## 2016-07-17 ENCOUNTER — Other Ambulatory Visit: Payer: Self-pay | Admitting: Internal Medicine

## 2016-07-17 DIAGNOSIS — R1084 Generalized abdominal pain: Secondary | ICD-10-CM

## 2016-07-17 DIAGNOSIS — K921 Melena: Secondary | ICD-10-CM

## 2016-07-24 ENCOUNTER — Ambulatory Visit
Admission: RE | Admit: 2016-07-24 | Discharge: 2016-07-24 | Disposition: A | Discharge status: Home / Self Care | Payer: Medicare Other | Source: Ambulatory Visit | Attending: Internal Medicine | Admitting: Internal Medicine

## 2016-07-24 DIAGNOSIS — K921 Melena: Secondary | ICD-10-CM | POA: Diagnosis present

## 2016-07-24 DIAGNOSIS — I700 Atherosclerosis of aorta: Secondary | ICD-10-CM | POA: Insufficient documentation

## 2016-07-24 DIAGNOSIS — K449 Diaphragmatic hernia without obstruction or gangrene: Secondary | ICD-10-CM | POA: Insufficient documentation

## 2016-07-24 DIAGNOSIS — D3911 Neoplasm of uncertain behavior of right ovary: Secondary | ICD-10-CM | POA: Diagnosis not present

## 2016-07-24 DIAGNOSIS — K5730 Diverticulosis of large intestine without perforation or abscess without bleeding: Secondary | ICD-10-CM | POA: Insufficient documentation

## 2016-07-24 DIAGNOSIS — R1084 Generalized abdominal pain: Secondary | ICD-10-CM | POA: Diagnosis present

## 2016-07-24 HISTORY — DX: Essential (primary) hypertension: I10

## 2016-07-24 MED ORDER — IOPAMIDOL (ISOVUE-300) INJECTION 61%
100.0000 mL | Freq: Once | INTRAVENOUS | Status: AC | PRN
  Administered 2016-07-24: 100 mL via INTRAVENOUS

## 2016-09-25 ENCOUNTER — Inpatient Hospital Stay: Payer: Medicare Other

## 2016-10-02 ENCOUNTER — Inpatient Hospital Stay: Payer: Medicare Other | Attending: Obstetrics and Gynecology

## 2016-11-06 ENCOUNTER — Encounter: Admission: RE | Payer: Self-pay | Source: Ambulatory Visit

## 2016-11-06 ENCOUNTER — Ambulatory Visit
Admission: RE | Admit: 2016-11-06 | Payer: Medicare Other | Source: Ambulatory Visit | Admitting: Unknown Physician Specialty

## 2016-11-06 SURGERY — COLONOSCOPY WITH PROPOFOL
Anesthesia: General

## 2016-11-07 ENCOUNTER — Telehealth: Payer: Self-pay

## 2016-11-07 NOTE — Telephone Encounter (Signed)
  Oncology Nurse Navigator Documentation Effort made to reach out to Rhonda Barr regarding her referral from Dr. Leafy Ro for complex ovarian cyst. She has been scheduled twice and has not shown for appointment. Spoke with her regarding her Ct findings and possible need for surgery per Dr. Leafy Ro. She declined to rescheduled stating her symptoms were better. Dr. Leafy Ro notified that referral will be deferred back to her. If patient changes her mind we will be happy to consult with her. Navigator Location: [facility] (11/07/16 1600) Referral date to RadOnc/MedOnc: 09/23/16 (11/07/16 1600) )Navigator Encounter Type: Telephone (11/07/16 1600) Telephone: Outgoing Call (11/07/16 1600)                   Patient Visit Type: GynOnc (11/07/16 1600) Treatment Phase: Abnormal Scans (11/07/16 1600)     Interventions: Coordination of Care (11/07/16 1600)   Coordination of Care: Appts (11/07/16 1600)                  Time Spent with Patient: 15 (11/07/16 1600)

## 2017-05-20 ENCOUNTER — Ambulatory Visit: Payer: Medicare Other | Admitting: Family

## 2017-05-26 ENCOUNTER — Encounter: Payer: Self-pay | Admitting: Family

## 2017-05-26 ENCOUNTER — Ambulatory Visit: Payer: Medicare Other | Attending: Family | Admitting: Family

## 2017-05-26 VITALS — BP 113/73 | HR 76 | Resp 18 | Ht 63.0 in | Wt 135.4 lb

## 2017-05-26 DIAGNOSIS — I110 Hypertensive heart disease with heart failure: Secondary | ICD-10-CM | POA: Insufficient documentation

## 2017-05-26 DIAGNOSIS — Z79899 Other long term (current) drug therapy: Secondary | ICD-10-CM | POA: Diagnosis not present

## 2017-05-26 DIAGNOSIS — Z8541 Personal history of malignant neoplasm of cervix uteri: Secondary | ICD-10-CM | POA: Insufficient documentation

## 2017-05-26 DIAGNOSIS — I959 Hypotension, unspecified: Secondary | ICD-10-CM | POA: Diagnosis not present

## 2017-05-26 DIAGNOSIS — F419 Anxiety disorder, unspecified: Secondary | ICD-10-CM | POA: Diagnosis not present

## 2017-05-26 DIAGNOSIS — I950 Idiopathic hypotension: Secondary | ICD-10-CM

## 2017-05-26 DIAGNOSIS — E079 Disorder of thyroid, unspecified: Secondary | ICD-10-CM | POA: Insufficient documentation

## 2017-05-26 DIAGNOSIS — E785 Hyperlipidemia, unspecified: Secondary | ICD-10-CM | POA: Diagnosis not present

## 2017-05-26 DIAGNOSIS — Z7982 Long term (current) use of aspirin: Secondary | ICD-10-CM | POA: Diagnosis not present

## 2017-05-26 DIAGNOSIS — I5032 Chronic diastolic (congestive) heart failure: Secondary | ICD-10-CM | POA: Insufficient documentation

## 2017-05-26 DIAGNOSIS — F17210 Nicotine dependence, cigarettes, uncomplicated: Secondary | ICD-10-CM | POA: Diagnosis not present

## 2017-05-26 DIAGNOSIS — I4891 Unspecified atrial fibrillation: Secondary | ICD-10-CM | POA: Insufficient documentation

## 2017-05-26 DIAGNOSIS — Z720 Tobacco use: Secondary | ICD-10-CM

## 2017-05-26 DIAGNOSIS — J449 Chronic obstructive pulmonary disease, unspecified: Secondary | ICD-10-CM | POA: Diagnosis not present

## 2017-05-26 NOTE — Progress Notes (Signed)
Patient ID: Rhonda Barr, female    DOB: [birth year], 66 y.o.   MRN: [id number]  HPI  Ms Rhonda Barr is a 66 y/o female with a history of atrial fibrillation, cervical cancer, COPD, hyperlipidemia, HTN, thyroid disease, tobacco use and chronic heart failure.   Echo done 11/02/15 reviewed and showed and EF of 55% along with mild AR/TR/MR.  Has not been admitted or in the ED in the last year.  She presents today for a follow-up visit although hasn't been seen since 2017. She has a chief complaint of mild shortness of breath upon moderate exertion. She describes this as chronic in nature over the last few years with varying levels of intensity. She has associated chest tightness, light-headedness and anxiety along with this. Weighing daily and says that her weight has been stable. Denies any fatigue or edema.   Past Medical History:  Diagnosis Date  . Atrial fibrillation ([place])   . Cervical cancer ([place])   . CHF (congestive heart failure) ([place])   . COPD (chronic obstructive pulmonary disease) ([place])   . Hyperlipidemia   . Hypertension   . Thyroid disease    Past Surgical History:  Procedure Laterality Date  . CERVICAL BIOPSY    . CORNEAL TRANSPLANT Right    No family history on file. Social History  Substance Use Topics  . Smoking status: Current Every Day Smoker    Packs/day: 0.75    Years: 40.00    Types: Cigarettes  . Smokeless tobacco: Never Used  . Alcohol use 0.0 oz/week   Allergies  Allergen Reactions  . Tetracyclines & Related Rash   Prior to Admission medications   Medication Sig Start Date End Date Taking? Authorizing Provider  acetaminophen (TYLENOL) 500 MG tablet Take 500 mg by mouth every 6 (six) hours as needed.   Yes [provider]  albuterol (PROVENTIL HFA;VENTOLIN HFA) 108 (90 Base) MCG/ACT inhaler Inhale 2 puffs into the lungs every 6 (six) hours as needed.   Yes [provider]  aspirin EC 81 MG tablet Take 81 mg by mouth daily.   Yes  [provider]  diphenhydrAMINE (BENADRYL) 25 MG tablet Take 25 mg by mouth every 6 (six) hours as needed.   Yes [provider]  ergocalciferol (VITAMIN D2) 50000 units capsule Take 50,000 Units by mouth once a week.   Yes [provider]  furosemide (LASIX) 20 MG tablet Take 20 mg by mouth daily.   Yes [provider]  levothyroxine (SYNTHROID, LEVOTHROID) 50 MCG tablet Take 50 mcg by mouth daily before breakfast.   Yes [provider]  lisinopril (PRINIVIL,ZESTRIL) 2.5 MG tablet Take 2.5 mg by mouth daily.   Yes [provider]  omeprazole (PRILOSEC) 20 MG capsule Take 20 mg by mouth daily.   Yes [provider]  ondansetron (ZOFRAN) 4 MG tablet Take 4 mg by mouth every 8 (eight) hours as needed.   Yes [provider]  Tetrahydroz-Dextran-PEG-Povid 0.05-0.1-1-1 % SOLN Apply 1-2 drops to eye every 6 (six) hours as needed.   Yes [provider]  tiotropium (SPIRIVA) 18 MCG inhalation capsule Place 18 mcg into inhaler and inhale daily.   Yes [provider]  traMADol (ULTRAM) 50 MG tablet Take 1 tablet by mouth every 6 (six) hours as needed.    [provider]     Review of Systems  Constitutional: Negative for appetite change and fatigue.  HENT: Negative for congestion, postnasal drip and sore throat.   Eyes:  Positive for visual disturbance. Negative for pain.  Respiratory: Positive for chest tightness (chest pressure) and shortness of breath. Negative for cough.   Cardiovascular: Negative for chest pain and palpitations.  Gastrointestinal: Positive for abdominal pain. Negative for abdominal distention.  Endocrine: Negative.   Genitourinary: Negative.   Musculoskeletal: Positive for back pain and neck pain.  Skin: Negative.   Allergic/Immunologic: Negative.   Neurological: Positive for light-headedness. Negative for dizziness.  Hematological: Negative for adenopathy. Does not bruise/bleed  easily.  Psychiatric/Behavioral: Negative for dysphoric mood and sleep disturbance (sleeping on 2 pillows). The patient is nervous/anxious (at times).    Vitals:   05/26/17 1248  BP: 113/73  Pulse: 76  Resp: 18  SpO2: 99%  Weight: 135 lb 6 oz (61.4 kg)  Height: 5\' 3"  (1.6 m)   Wt Readings from Last 3 Encounters:  05/26/17 135 lb 6 oz (61.4 kg)  09/26/14 132 lb (59.9 kg)   Lab Results  Component Value Date   CREATININE 0.63 07/13/2014   CREATININE 0.81 07/12/2014   CREATININE 0.49 (L) 02/16/2013   Physical Exam  Constitutional: She is oriented to person, place, and time. She appears well-developed and well-nourished.  HENT:  Head: Normocephalic and atraumatic.  Neck: Normal range of motion. Neck supple. No JVD present.  Cardiovascular: Normal rate and regular rhythm.   Pulmonary/Chest: Effort normal. She has no wheezes. She has no rales.  Abdominal: Soft. She exhibits no distension.  Musculoskeletal: She exhibits no edema or tenderness.  Neurological: She is alert and oriented to person, place, and time.  Skin: Skin is warm and dry.  Psychiatric: Her mood appears anxious. She is not agitated. She is inattentive.  Nursing note and vitals reviewed.   Assessment & Plan:  1: Chronic heart failure with preserved ejection fraction- - NYHA class II - euvolemic today - weighing daily; reminded to call for an overnight weight gain of >2 pounds or a weekly weight gain of >5 pounds - not adding salt to her food and trying to eat low sodium foods - walking some in her house - saw cardiologist Nehemiah Massed) 05/14/16 and returns 06/04/17 - needs updated echo done; if not done by cardiology, will schedule it at her next appointment with [place]  2: Hypotension- - BP looks good today - saw PCP Candiss Norse) 03/17/17 - BMP from 01/21/17 reviewed and shows sodium 138, potassium 4.3 and GFR 72  3: Anxiety- - patient a little anxious in the office - mom came and talked to me privately after the visit  and says that patient had an "emotional breakdown" some time past but that she's doing well now. Mom thought it was related to tramadol use  4: Tobacco use- - smoking daily but difficult to get a clear answer on how much she's smoking - complete cessation discussed for 3 minutes with her  At the end of the visit, patient asked for tramadol for her back/neck pain. Advised patient that I do not prescribe pain medication and that she would need to speak with her PCP about this.   Patient did not bring her medications nor a list. Each medication was verbally reviewed with the patient and she was encouraged to bring the bottles to every visit to confirm accuracy of list.  Return in 6 months or sooner for any questions/problems before then.

## 2017-05-26 NOTE — Patient Instructions (Addendum)
Continue weighing daily and call for an overnight weight gain of > 2 pounds or a weekly weight gain of >5 pounds.    Smoking Cessation Quitting smoking is important to your health and has many advantages. However, it is not always easy to quit since nicotine is a very addictive drug. Oftentimes, people try 3 times or more before being able to quit. This document explains the best ways for you to prepare to quit smoking. Quitting takes hard work and a lot of effort, but you can do it. ADVANTAGES OF QUITTING SMOKING  You will live longer, feel better, and live better.  Your body will feel the impact of quitting smoking almost immediately.  Within 20 minutes, blood pressure decreases. Your pulse returns to its normal level.  After 8 hours, carbon monoxide levels in the blood return to normal. Your oxygen level increases.  After 24 hours, the chance of having a heart attack starts to decrease. Your breath, hair, and body stop smelling like smoke.  After 48 hours, damaged nerve endings begin to recover. Your sense of taste and smell improve.  After 72 hours, the body is virtually free of nicotine. Your bronchial tubes relax and breathing becomes easier.  After 2 to 12 weeks, lungs can hold more air. Exercise becomes easier and circulation improves.  The risk of having a heart attack, stroke, cancer, or lung disease is greatly reduced.  After 1 year, the risk of coronary heart disease is cut in half.  After 5 years, the risk of stroke falls to the same as a nonsmoker.  After 10 years, the risk of lung cancer is cut in half and the risk of other cancers decreases significantly.  After 15 years, the risk of coronary heart disease drops, usually to the level of a nonsmoker.  If you are pregnant, quitting smoking will improve your chances of having a healthy baby.  The people you live with, especially any children, will be healthier.  You will have extra money to spend on things other  than cigarettes. QUESTIONS TO THINK ABOUT BEFORE ATTEMPTING TO QUIT You may want to talk about your answers with your health care provider.  Why do you want to quit?  If you tried to quit in the past, what helped and what did not?  What will be the most difficult situations for you after you quit? How will you plan to handle them?  Who can help you through the tough times? Your family? Friends? A health care provider?  What pleasures do you get from smoking? What ways can you still get pleasure if you quit? Here are some questions to ask your health care provider:  How can you help me to be successful at quitting?  What medicine do you think would be best for me and how should I take it?  What should I do if I need more help?  What is smoking withdrawal like? How can I get information on withdrawal? GET READY  Set a quit date.  Change your environment by getting rid of all cigarettes, ashtrays, matches, and lighters in your home, car, or work. Do not let people smoke in your home.  Review your past attempts to quit. Think about what worked and what did not. GET SUPPORT AND ENCOURAGEMENT You have a better chance of being successful if you have help. You can get support in many ways.  Tell your family, friends, and coworkers that you are going to quit and need their support. Ask   them not to smoke around you.  Get individual, group, or telephone counseling and support. Programs are available at local hospitals and health centers. Call your local health department for information about programs in your area.  Spiritual beliefs and practices may help some smokers quit.  Download a "quit meter" on your computer to keep track of quit statistics, such as how long you have gone without smoking, cigarettes not smoked, and money saved.  Get a self-help book about quitting smoking and staying off tobacco. LEARN NEW SKILLS AND BEHAVIORS  Distract yourself from urges to smoke. Talk to  someone, go for a walk, or occupy your time with a task.  Change your normal routine. Take a different route to work. Drink tea instead of coffee. Eat breakfast in a different place.  Reduce your stress. Take a hot bath, exercise, or read a book.  Plan something enjoyable to do every day. Reward yourself for not smoking.  Explore interactive web-based programs that specialize in helping you quit. GET MEDICINE AND USE IT CORRECTLY Medicines can help you stop smoking and decrease the urge to smoke. Combining medicine with the above behavioral methods and support can greatly increase your chances of successfully quitting smoking.  Nicotine replacement therapy helps deliver nicotine to your body without the negative effects and risks of smoking. Nicotine replacement therapy includes nicotine gum, lozenges, inhalers, nasal sprays, and skin patches. Some may be available over-the-counter and others require a prescription.  Antidepressant medicine helps people abstain from smoking, but how this works is unknown. This medicine is available by prescription.  Nicotinic receptor partial agonist medicine simulates the effect of nicotine in your brain. This medicine is available by prescription. Ask your health care provider for advice about which medicines to use and how to use them based on your health history. Your health care provider will tell you what side effects to look out for if you choose to be on a medicine or therapy. Carefully read the information on the package. Do not use any other product containing nicotine while using a nicotine replacement product.  RELAPSE OR DIFFICULT SITUATIONS Most relapses occur within the first 3 months after quitting. Do not be discouraged if you start smoking again. Remember, most people try several times before finally quitting. You may have symptoms of withdrawal because your body is used to nicotine. You may crave cigarettes, be irritable, feel very hungry, cough  often, get headaches, or have difficulty concentrating. The withdrawal symptoms are only temporary. They are strongest when you first quit, but they will go away within 10-14 days. To reduce the chances of relapse, try to:  Avoid drinking alcohol. Drinking lowers your chances of successfully quitting.  Reduce the amount of caffeine you consume. Once you quit smoking, the amount of caffeine in your body increases and can give you symptoms, such as a rapid heartbeat, sweating, and anxiety.  Avoid smokers because they can make you want to smoke.  Do not let weight gain distract you. Many smokers will gain weight when they quit, usually less than 10 pounds. Eat a healthy diet and stay active. You can always lose the weight gained after you quit.  Find ways to improve your mood other than smoking. FOR MORE INFORMATION  www.smokefree.gov  Document Released: 09/17/2001 Document Revised: 02/07/2014 Document Reviewed: 01/02/2012 Exit[employer] Patient Information 2015 [employer]. This information is not intended to replace advice given to you by your health care provider. Make sure you discuss any questions you have with your   health care provider.  

## 2017-05-27 DIAGNOSIS — I5032 Chronic diastolic (congestive) heart failure: Secondary | ICD-10-CM | POA: Insufficient documentation

## 2017-05-27 DIAGNOSIS — F419 Anxiety disorder, unspecified: Secondary | ICD-10-CM | POA: Insufficient documentation

## 2017-11-26 ENCOUNTER — Encounter: Payer: Self-pay | Admitting: Family

## 2017-11-26 ENCOUNTER — Ambulatory Visit: Payer: Medicare Other | Attending: Family | Admitting: Family

## 2017-11-26 VITALS — BP 109/75 | HR 76 | Resp 18 | Ht 63.0 in | Wt 130.1 lb

## 2017-11-26 DIAGNOSIS — F17210 Nicotine dependence, cigarettes, uncomplicated: Secondary | ICD-10-CM | POA: Diagnosis not present

## 2017-11-26 DIAGNOSIS — I959 Hypotension, unspecified: Secondary | ICD-10-CM | POA: Diagnosis not present

## 2017-11-26 DIAGNOSIS — Z720 Tobacco use: Secondary | ICD-10-CM

## 2017-11-26 DIAGNOSIS — Z8541 Personal history of malignant neoplasm of cervix uteri: Secondary | ICD-10-CM | POA: Insufficient documentation

## 2017-11-26 DIAGNOSIS — F419 Anxiety disorder, unspecified: Secondary | ICD-10-CM

## 2017-11-26 DIAGNOSIS — J449 Chronic obstructive pulmonary disease, unspecified: Secondary | ICD-10-CM | POA: Insufficient documentation

## 2017-11-26 DIAGNOSIS — Z7982 Long term (current) use of aspirin: Secondary | ICD-10-CM | POA: Insufficient documentation

## 2017-11-26 DIAGNOSIS — I5032 Chronic diastolic (congestive) heart failure: Secondary | ICD-10-CM

## 2017-11-26 DIAGNOSIS — I509 Heart failure, unspecified: Secondary | ICD-10-CM | POA: Diagnosis not present

## 2017-11-26 DIAGNOSIS — I110 Hypertensive heart disease with heart failure: Secondary | ICD-10-CM | POA: Diagnosis not present

## 2017-11-26 DIAGNOSIS — Z79899 Other long term (current) drug therapy: Secondary | ICD-10-CM | POA: Diagnosis not present

## 2017-11-26 DIAGNOSIS — I950 Idiopathic hypotension: Secondary | ICD-10-CM

## 2017-11-26 NOTE — Patient Instructions (Addendum)
Continue weighing daily and call for an overnight weight gain of > 2 pounds or a weekly weight gain of >5 pounds.    Smoking Cessation Quitting smoking is important to your health and has many advantages. However, it is not always easy to quit since nicotine is a very addictive drug. Oftentimes, people try 3 times or more before being able to quit. This document explains the best ways for you to prepare to quit smoking. Quitting takes hard work and a lot of effort, but you can do it. ADVANTAGES OF QUITTING SMOKING  You will live longer, feel better, and live better.  Your body will feel the impact of quitting smoking almost immediately.  Within 20 minutes, blood pressure decreases. Your pulse returns to its normal level.  After 8 hours, carbon monoxide levels in the blood return to normal. Your oxygen level increases.  After 24 hours, the chance of having a heart attack starts to decrease. Your breath, hair, and body stop smelling like smoke.  After 48 hours, damaged nerve endings begin to recover. Your sense of taste and smell improve.  After 72 hours, the body is virtually free of nicotine. Your bronchial tubes relax and breathing becomes easier.  After 2 to 12 weeks, lungs can hold more air. Exercise becomes easier and circulation improves.  The risk of having a heart attack, stroke, cancer, or lung disease is greatly reduced.  After 1 year, the risk of coronary heart disease is cut in half.  After 5 years, the risk of stroke falls to the same as a nonsmoker.  After 10 years, the risk of lung cancer is cut in half and the risk of other cancers decreases significantly.  After 15 years, the risk of coronary heart disease drops, usually to the level of a nonsmoker.  If you are pregnant, quitting smoking will improve your chances of having a healthy baby.  The people you live with, especially any children, will be healthier.  You will have extra money to spend on things other  than cigarettes. QUESTIONS TO THINK ABOUT BEFORE ATTEMPTING TO QUIT You may want to talk about your answers with your health care provider.  Why do you want to quit?  If you tried to quit in the past, what helped and what did not?  What will be the most difficult situations for you after you quit? How will you plan to handle them?  Who can help you through the tough times? Your family? Friends? A health care provider?  What pleasures do you get from smoking? What ways can you still get pleasure if you quit? Here are some questions to ask your health care provider:  How can you help me to be successful at quitting?  What medicine do you think would be best for me and how should I take it?  What should I do if I need more help?  What is smoking withdrawal like? How can I get information on withdrawal? GET READY  Set a quit date.  Change your environment by getting rid of all cigarettes, ashtrays, matches, and lighters in your home, car, or work. Do not let people smoke in your home.  Review your past attempts to quit. Think about what worked and what did not. GET SUPPORT AND ENCOURAGEMENT You have a better chance of being successful if you have help. You can get support in many ways.  Tell your family, friends, and coworkers that you are going to quit and need their support. Ask   them not to smoke around you.  Get individual, group, or telephone counseling and support. Programs are available at local hospitals and health centers. Call your local health department for information about programs in your area.  Spiritual beliefs and practices may help some smokers quit.  Download a "quit meter" on your computer to keep track of quit statistics, such as how long you have gone without smoking, cigarettes not smoked, and money saved.  Get a self-help book about quitting smoking and staying off tobacco. LEARN NEW SKILLS AND BEHAVIORS  Distract yourself from urges to smoke. Talk to  someone, go for a walk, or occupy your time with a task.  Change your normal routine. Take a different route to work. Drink tea instead of coffee. Eat breakfast in a different place.  Reduce your stress. Take a hot bath, exercise, or read a book.  Plan something enjoyable to do every day. Reward yourself for not smoking.  Explore interactive web-based programs that specialize in helping you quit. GET MEDICINE AND USE IT CORRECTLY Medicines can help you stop smoking and decrease the urge to smoke. Combining medicine with the above behavioral methods and support can greatly increase your chances of successfully quitting smoking.  Nicotine replacement therapy helps deliver nicotine to your body without the negative effects and risks of smoking. Nicotine replacement therapy includes nicotine gum, lozenges, inhalers, nasal sprays, and skin patches. Some may be available over-the-counter and others require a prescription.  Antidepressant medicine helps people abstain from smoking, but how this works is unknown. This medicine is available by prescription.  Nicotinic receptor partial agonist medicine simulates the effect of nicotine in your brain. This medicine is available by prescription. Ask your health care provider for advice about which medicines to use and how to use them based on your health history. Your health care provider will tell you what side effects to look out for if you choose to be on a medicine or therapy. Carefully read the information on the package. Do not use any other product containing nicotine while using a nicotine replacement product.  RELAPSE OR DIFFICULT SITUATIONS Most relapses occur within the first 3 months after quitting. Do not be discouraged if you start smoking again. Remember, most people try several times before finally quitting. You may have symptoms of withdrawal because your body is used to nicotine. You may crave cigarettes, be irritable, feel very hungry, cough  often, get headaches, or have difficulty concentrating. The withdrawal symptoms are only temporary. They are strongest when you first quit, but they will go away within 10-14 days. To reduce the chances of relapse, try to:  Avoid drinking alcohol. Drinking lowers your chances of successfully quitting.  Reduce the amount of caffeine you consume. Once you quit smoking, the amount of caffeine in your body increases and can give you symptoms, such as a rapid heartbeat, sweating, and anxiety.  Avoid smokers because they can make you want to smoke.  Do not let weight gain distract you. Many smokers will gain weight when they quit, usually less than 10 pounds. Eat a healthy diet and stay active. You can always lose the weight gained after you quit.  Find ways to improve your mood other than smoking. FOR MORE INFORMATION  www.smokefree.gov  Document Released: 09/17/2001 Document Revised: 02/07/2014 Document Reviewed: 01/02/2012 Exit[employer] Patient Information 2015 [employer]. This information is not intended to replace advice given to you by your health care provider. Make sure you discuss any questions you have with your   health care provider.  

## 2017-11-26 NOTE — Progress Notes (Signed)
Patient ID: Rhonda Barr, female    DOB: 1951-02-26, 67 y.o.   MRN: [id number]  HPI  Rhonda Barr is a 67 y/o female with a history of atrial fibrillation, cervical cancer, COPD, hyperlipidemia, HTN, thyroid disease, tobacco use and chronic heart failure.   Echo done 11/02/15 reviewed and showed and EF of 55% along with mild AR/TR/MR.  Has not been admitted or in the ED in the last year.  She presents today for a follow-up visit with a chief complaint of minimal shortness of breath upon moderate exertion. She says this has been present for several years. She has associated chest tightness, palpitations, light-headedness, anxiety and decreased appetite along with this. She denies any cough, edema, abdominal distention, difficulty sleeping or weight gain.   Past Medical History:  Diagnosis Date  . Atrial fibrillation ([place])   . Cervical cancer ([place])   . CHF (congestive heart failure) ([place])   . COPD (chronic obstructive pulmonary disease) ([place])   . Hyperlipidemia   . Hypertension   . Thyroid disease    Social History   Tobacco Use  . Smoking status: Current Every Day Smoker    Packs/day: 0.75    Years: 40.00    Pack years: 30.00    Types: Cigarettes  . Smokeless tobacco: Never Used  Substance Use Topics  . Alcohol use: Yes    Alcohol/week: 0.0 oz   Allergies  Allergen Reactions  . Tetracyclines & Related Rash   Prior to Admission medications   Medication Sig Start Date End Date Taking? Authorizing Provider  acetaminophen (TYLENOL) 500 MG tablet Take 500 mg by mouth every 6 (six) hours as needed.   Yes [provider]  albuterol (PROVENTIL HFA;VENTOLIN HFA) 108 (90 Base) MCG/ACT inhaler Inhale 2 puffs into the lungs every 6 (six) hours as needed.   Yes [provider]  aspirin EC 81 MG tablet Take 81 mg by mouth daily.   Yes [provider]  diphenhydrAMINE (BENADRYL) 25 MG tablet Take 25 mg by mouth every 6 (six) hours as needed.   Yes [provider]  ergocalciferol (VITAMIN D2) 50000 units capsule Take 50,000 Units by mouth once a week.   Yes [provider]  furosemide (LASIX) 20 MG tablet Take 20 mg by mouth daily.   Yes [provider]  levothyroxine (SYNTHROID, LEVOTHROID) 50 MCG tablet Take 50 mcg by mouth daily before breakfast.   Yes [provider]  lisinopril (PRINIVIL,ZESTRIL) 2.5 MG tablet Take 2.5 mg by mouth daily.   Yes [provider]  omeprazole (PRILOSEC) 20 MG capsule Take 20 mg by mouth daily.   Yes [provider]  ondansetron (ZOFRAN) 4 MG tablet Take 4 mg by mouth every 8 (eight) hours as needed.   Yes [provider]  Tetrahydroz-Dextran-PEG-Povid 0.05-0.1-1-1 % SOLN Apply 1-2 drops to eye every 6 (six) hours as needed.   Yes [provider]  tiotropium (SPIRIVA) 18 MCG inhalation capsule Place 18 mcg into inhaler and inhale daily.   Yes [provider]  traMADol (ULTRAM) 50 MG tablet Take 1 tablet by mouth every 6 (six) hours as needed.   Yes [provider]    Review of Systems  Constitutional: Positive for appetite change (decreased). Negative for fatigue and fever.  HENT: Negative for congestion, postnasal drip and sore throat.   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 Negative for pain.  Respiratory: Positive for chest tightness (chest pressure) and shortness  of breath. Negative for cough.   Cardiovascular: Positive for palpitations. Negative for chest pain and leg swelling.  Gastrointestinal: Negative for abdominal distention and abdominal pain.  Endocrine: Negative.   Genitourinary: Negative.   Musculoskeletal: Positive for arthralgias (ankle pain), back pain and neck pain.  Skin: Negative.   Allergic/Immunologic: Negative.   Neurological: Positive for light-headedness. Negative for dizziness.  Hematological: Negative for adenopathy. Does not bruise/bleed easily.   Psychiatric/Behavioral: Negative for dysphoric mood and sleep disturbance (sleeping on 2 pillows). The patient is nervous/anxious (at times).    Vitals:   11/26/17 1200  BP: 109/75  Pulse: 76  Resp: 18  SpO2: 100%  Weight: 130 lb 2 oz (59 kg)  Height: 5\' 3"  (1.6 m)   Wt Readings from Last 3 Encounters:  11/26/17 130 lb 2 oz (59 kg)  05/26/17 135 lb 6 oz (61.4 kg)  09/26/14 132 lb (59.9 kg)   Lab Results  Component Value Date   CREATININE 0.63 07/13/2014   CREATININE 0.81 07/12/2014   CREATININE 0.49 (L) 02/16/2013    Physical Exam  Constitutional: She is oriented to person, place, and time. She appears well-developed and well-nourished.  HENT:  Head: Normocephalic and atraumatic.  Neck: Normal range of motion. Neck supple. No JVD present.  Cardiovascular: Normal rate and regular rhythm.  Pulmonary/Chest: Effort normal. She has no wheezes. She has no rales.  Abdominal: Soft. She exhibits no distension.  Musculoskeletal: She exhibits no edema or tenderness.  Neurological: She is alert and oriented to person, place, and time.  Skin: Skin is warm and dry.  Psychiatric: Her mood appears anxious. She is not agitated.  Nursing note and vitals reviewed.   Assessment & Plan:  1: Chronic heart failure with preserved ejection fraction- - NYHA class II - euvolemic today - weighing daily; reminded to call for an overnight weight gain of >2 pounds or a weekly weight gain of >5 pounds - weight down 5 pounds since she was last here August 2018 - not adding salt to her food and trying to eat low sodium foods - walking some in her house - saw cardiologist Rhonda Barr) 05/14/16  - echo scheduled for 12/16/17  2: Hypotension- - BP on the low side; encouraged slow position changes - saw PCP Rhonda Barr) 03/17/17 - BMP from 01/21/17 reviewed and shows sodium 138, potassium 4.3 and GFR 72  3: Anxiety- - patient anxious in the office and tends to not make sense when she's talking at times -  previous mental breakdown in the past; mom is current caregiver  4: Tobacco use- - smoking daily but difficult to get a clear answer on how much she's smoking - complete cessation discussed for 3 minutes with her  Patient did not bring her medications nor a list. Each medication was verbally reviewed with the patient and she was encouraged to bring the bottles to every visit to confirm accuracy of list.  Return in 6 months or sooner for any questions/problems before then.

## 2017-12-16 ENCOUNTER — Ambulatory Visit
Admission: RE | Admit: 2017-12-16 | Discharge: 2017-12-16 | Disposition: A | Discharge status: Home / Self Care | Payer: Medicare Other | Source: Ambulatory Visit | Attending: Family | Admitting: Family

## 2017-12-16 DIAGNOSIS — I5032 Chronic diastolic (congestive) heart failure: Secondary | ICD-10-CM | POA: Diagnosis present

## 2017-12-16 DIAGNOSIS — I351 Nonrheumatic aortic (valve) insufficiency: Secondary | ICD-10-CM | POA: Diagnosis not present

## 2017-12-16 DIAGNOSIS — I110 Hypertensive heart disease with heart failure: Secondary | ICD-10-CM | POA: Insufficient documentation

## 2017-12-16 DIAGNOSIS — I4891 Unspecified atrial fibrillation: Secondary | ICD-10-CM | POA: Insufficient documentation

## 2017-12-16 DIAGNOSIS — J449 Chronic obstructive pulmonary disease, unspecified: Secondary | ICD-10-CM | POA: Diagnosis not present

## 2017-12-16 DIAGNOSIS — Z8541 Personal history of malignant neoplasm of cervix uteri: Secondary | ICD-10-CM | POA: Diagnosis not present

## 2017-12-16 DIAGNOSIS — E785 Hyperlipidemia, unspecified: Secondary | ICD-10-CM | POA: Diagnosis not present

## 2017-12-16 NOTE — Progress Notes (Signed)
*  PRELIMINARY RESULTS* Echocardiogram 2D Echocardiogram has been performed.  Rhonda Barr 12/16/2017, 11:46 AM

## 2018-05-27 ENCOUNTER — Encounter: Payer: Self-pay | Admitting: Family

## 2018-05-27 ENCOUNTER — Ambulatory Visit: Payer: Medicare Other | Attending: Family | Admitting: Family

## 2018-05-27 VITALS — BP 110/60 | HR 70 | Resp 18 | Ht 63.0 in | Wt 134.0 lb

## 2018-05-27 DIAGNOSIS — J449 Chronic obstructive pulmonary disease, unspecified: Secondary | ICD-10-CM | POA: Diagnosis not present

## 2018-05-27 DIAGNOSIS — I509 Heart failure, unspecified: Secondary | ICD-10-CM | POA: Diagnosis present

## 2018-05-27 DIAGNOSIS — I110 Hypertensive heart disease with heart failure: Secondary | ICD-10-CM | POA: Insufficient documentation

## 2018-05-27 DIAGNOSIS — I10 Essential (primary) hypertension: Secondary | ICD-10-CM

## 2018-05-27 DIAGNOSIS — F419 Anxiety disorder, unspecified: Secondary | ICD-10-CM | POA: Insufficient documentation

## 2018-05-27 DIAGNOSIS — F17210 Nicotine dependence, cigarettes, uncomplicated: Secondary | ICD-10-CM | POA: Insufficient documentation

## 2018-05-27 DIAGNOSIS — Z79899 Other long term (current) drug therapy: Secondary | ICD-10-CM | POA: Diagnosis not present

## 2018-05-27 DIAGNOSIS — Z7982 Long term (current) use of aspirin: Secondary | ICD-10-CM | POA: Diagnosis not present

## 2018-05-27 DIAGNOSIS — Z720 Tobacco use: Secondary | ICD-10-CM

## 2018-05-27 DIAGNOSIS — I5032 Chronic diastolic (congestive) heart failure: Secondary | ICD-10-CM | POA: Insufficient documentation

## 2018-05-27 DIAGNOSIS — E079 Disorder of thyroid, unspecified: Secondary | ICD-10-CM | POA: Diagnosis not present

## 2018-05-27 DIAGNOSIS — Z79890 Hormone replacement therapy (postmenopausal): Secondary | ICD-10-CM | POA: Diagnosis not present

## 2018-05-27 DIAGNOSIS — E785 Hyperlipidemia, unspecified: Secondary | ICD-10-CM | POA: Insufficient documentation

## 2018-05-27 DIAGNOSIS — I4891 Unspecified atrial fibrillation: Secondary | ICD-10-CM | POA: Diagnosis not present

## 2018-05-27 DIAGNOSIS — Z8541 Personal history of malignant neoplasm of cervix uteri: Secondary | ICD-10-CM | POA: Diagnosis not present

## 2018-05-27 NOTE — Patient Instructions (Signed)
Continue weighing daily and call for an overnight weight gain of > 2 pounds or a weekly weight gain of >5 pounds. 

## 2018-05-27 NOTE — Progress Notes (Signed)
Patient ID: Rhonda Barr, female    DOB: June 28, 1951, 67 y.o.   MRN: [id number]  HPI  Rhonda Barr is a 67 y/o female with a history of atrial fibrillation, cervical cancer, COPD, hyperlipidemia, HTN, thyroid disease, tobacco use and chronic heart failure.   Echo report from 12/16/17 reviewed and showed an EF of 60-65% along with mild AR. Echo done 11/02/15 reviewed and showed and EF of 55% along with mild AR/TR/MR.  Has not been admitted or in the ED in the last year.  She presents today for a follow-up visit with a chief complaint of minimal shortness of breath upon moderate exertion. She says this has been present for several years. She has associated fatigue, chest tightness, palpitations and anxiety.   Past Medical History:  Diagnosis Date  . Atrial fibrillation ([place])   . Cervical cancer ([place])   . CHF (congestive heart failure) ([place])   . COPD (chronic obstructive pulmonary disease) ([place])   . Hyperlipidemia   . Hypertension   . Thyroid disease    Social History   Tobacco Use  . Smoking status: Current Every Day Smoker    Packs/day: 0.75    Years: 40.00    Pack years: 30.00    Types: Cigarettes  . Smokeless tobacco: Never Used  Substance Use Topics  . Alcohol use: Yes    Alcohol/week: 0.0 standard drinks   Allergies  Allergen Reactions  . Tetracyclines & Related Rash   Prior to Admission medications   Medication Sig Start Date End Date Taking? Authorizing Provider  acetaminophen (TYLENOL) 500 MG tablet Take 500 mg by mouth every 6 (six) hours as needed.   Yes [provider]  albuterol (PROVENTIL HFA;VENTOLIN HFA) 108 (90 Base) MCG/ACT inhaler Inhale 2 puffs into the lungs every 6 (six) hours as needed.   Yes [provider]  aspirin EC 81 MG tablet Take 81 mg by mouth daily.   Yes [provider]  diphenhydrAMINE (BENADRYL) 25 MG tablet Take 25 mg by mouth every 6 (six) hours as needed.   Yes [provider]  ergocalciferol  (VITAMIN D2) 50000 units capsule Take 50,000 Units by mouth once a week.   Yes [provider]  furosemide (LASIX) 20 MG tablet Take 20 mg by mouth daily.   Yes [provider]  levothyroxine (SYNTHROID, LEVOTHROID) 50 MCG tablet Take 50 mcg by mouth daily before breakfast.   Yes [provider]  lisinopril (PRINIVIL,ZESTRIL) 2.5 MG tablet Take 2.5 mg by mouth daily.   Yes [provider]  omeprazole (PRILOSEC) 20 MG capsule Take 20 mg by mouth daily.   Yes [provider]  ondansetron (ZOFRAN) 4 MG tablet Take 4 mg by mouth every 8 (eight) hours as needed.   Yes [provider]  Tetrahydroz-Dextran-PEG-Povid 0.05-0.1-1-1 % SOLN Apply 1-2 drops to eye every 6 (six) hours as needed.   Yes [provider]  tiotropium (SPIRIVA) 18 MCG inhalation capsule Place 18 mcg into inhaler and inhale daily.   Yes [provider]  traMADol (ULTRAM) 50 MG tablet Take 1 tablet by mouth every 6 (six) hours as needed.   Yes [provider]   Review of Systems  Constitutional: Positive for appetite change (decreased). Negative for fever.  HENT: Negative for congestion, postnasal drip and sore throat.   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 Negative for pain.  Respiratory: Positive for chest tightness (chest pressure) and shortness of breath (at times).  Negative for cough.   Cardiovascular: Positive for palpitations. Negative for chest pain and leg swelling.  Gastrointestinal: Negative for abdominal distention.  Endocrine: Negative.   Genitourinary: Negative.   Musculoskeletal: Positive for back pain and neck pain. Negative for arthralgias.  Skin: Negative.   Allergic/Immunologic: Negative.   Neurological: Negative for dizziness and light-headedness.  Hematological: Negative for adenopathy. Does not bruise/bleed easily.  Psychiatric/Behavioral: Negative for dysphoric mood and sleep  disturbance (sleeping on 2 pillows). The patient is nervous/anxious (at times).    Vitals:   05/27/18 1211 05/27/18 1226  BP: (!) 147/111 110/60  Pulse: 70   Resp: 18   SpO2: 100%   Weight: 134 lb (60.8 kg)   Height: 5\' 3"  (1.6 m)    Wt Readings from Last 3 Encounters:  05/27/18 134 lb (60.8 kg)  11/26/17 130 lb 2 oz (59 kg)  05/26/17 135 lb 6 oz (61.4 kg)   Lab Results  Component Value Date   CREATININE 0.63 07/13/2014   CREATININE 0.81 07/12/2014   CREATININE 0.49 (L) 02/16/2013   Physical Exam  Constitutional: She is oriented to person, place, and time. She appears well-developed and well-nourished.  HENT:  Head: Normocephalic and atraumatic.  Neck: Normal range of motion. Neck supple. No JVD present.  Cardiovascular: Normal rate and regular rhythm.  Pulmonary/Chest: Effort normal. She has no wheezes. She has no rales.  Abdominal: Soft. She exhibits no distension.  Musculoskeletal: She exhibits no edema or tenderness.  Neurological: She is alert and oriented to person, place, and time.  Skin: Skin is warm and dry.  Psychiatric: Her mood appears anxious. She is not agitated.  Nursing note and vitals reviewed.   Assessment & Plan:  1: Chronic heart failure with preserved ejection fraction- - NYHA class II - euvolemic today - weighing daily; reminded to call for an overnight weight gain of >2 pounds or a weekly weight gain of >5 pounds - weight up 4 pounds since her last visit here 6 months ago - not adding salt to her food and trying to eat low sodium foods - walking some in her house - saw cardiologist Nehemiah Massed) 05/14/16   2: HTN- - BP initially elevated (147/111) and then rechecked with the manual cuff was 110/60 - saw PCP Candiss Norse) 03/17/17 - BMP from 01/21/17 reviewed and shows sodium 138, potassium 4.3 and GFR 72  3: Anxiety- - patient anxious in the office and tends to not make sense when she's talking at times - previous mental breakdown in the past; mom is  current caregiver  4: Tobacco use- - smoking daily but difficult to get a clear answer on how much she's smoking - complete cessation discussed for 3 minutes with her  Patient did not bring her medications nor a list. Each medication was verbally reviewed with the patient and she was encouraged to bring the bottles to every visit to confirm accuracy of list.  Will not make a return appointment at this time. Advised patient that she could call back at anytime to make another appointment.

## 2018-10-12 ENCOUNTER — Other Ambulatory Visit: Payer: Self-pay | Admitting: Internal Medicine

## 2018-10-12 DIAGNOSIS — Z1231 Encounter for screening mammogram for malignant neoplasm of breast: Secondary | ICD-10-CM

## 2018-11-02 ENCOUNTER — Ambulatory Visit
Admission: RE | Admit: 2018-11-02 | Discharge: 2018-11-02 | Disposition: A | Discharge status: Home / Self Care | Payer: Medicare Other | Source: Ambulatory Visit | Attending: Internal Medicine | Admitting: Internal Medicine

## 2018-11-02 DIAGNOSIS — Z1231 Encounter for screening mammogram for malignant neoplasm of breast: Secondary | ICD-10-CM | POA: Insufficient documentation

## 2018-11-11 ENCOUNTER — Other Ambulatory Visit: Payer: Self-pay | Admitting: Internal Medicine

## 2018-11-11 DIAGNOSIS — N6310 Unspecified lump in the right breast, unspecified quadrant: Secondary | ICD-10-CM

## 2018-11-11 DIAGNOSIS — R928 Other abnormal and inconclusive findings on diagnostic imaging of breast: Secondary | ICD-10-CM

## 2018-12-02 ENCOUNTER — Other Ambulatory Visit: Payer: Self-pay | Admitting: Internal Medicine

## 2018-12-02 ENCOUNTER — Ambulatory Visit: Admission: RE | Admit: 2018-12-02 | Payer: Medicare Other | Source: Ambulatory Visit

## 2018-12-02 ENCOUNTER — Ambulatory Visit
Admission: RE | Admit: 2018-12-02 | Discharge: 2018-12-02 | Disposition: A | Discharge status: Home / Self Care | Payer: Medicare Other | Source: Ambulatory Visit | Attending: Internal Medicine | Admitting: Internal Medicine

## 2018-12-02 DIAGNOSIS — N6320 Unspecified lump in the left breast, unspecified quadrant: Secondary | ICD-10-CM | POA: Diagnosis present

## 2018-12-02 DIAGNOSIS — N6310 Unspecified lump in the right breast, unspecified quadrant: Secondary | ICD-10-CM | POA: Diagnosis present

## 2018-12-02 DIAGNOSIS — R928 Other abnormal and inconclusive findings on diagnostic imaging of breast: Secondary | ICD-10-CM

## 2018-12-03 ENCOUNTER — Other Ambulatory Visit: Payer: Self-pay | Admitting: Internal Medicine

## 2018-12-03 DIAGNOSIS — R928 Other abnormal and inconclusive findings on diagnostic imaging of breast: Secondary | ICD-10-CM

## 2018-12-03 DIAGNOSIS — N6002 Solitary cyst of left breast: Secondary | ICD-10-CM

## 2019-03-08 IMAGING — US ULTRASOUND LEFT BREAST LIMITED
1 series · 13 of 13 positions shown · non-contrast
Comparison: Previous exam(s).

CLINICAL DATA: Patient was called back from screening mammogram for
a possible mass in the left breast.

EXAM:
DIGITAL DIAGNOSTIC LEFT MAMMOGRAM WITH TOMO
ULTRASOUND LEFT BREAST

[Series 1: ultrasound left breast limited · 0.05mm/px · 13 of 13 slices shown]
[im 1/13]
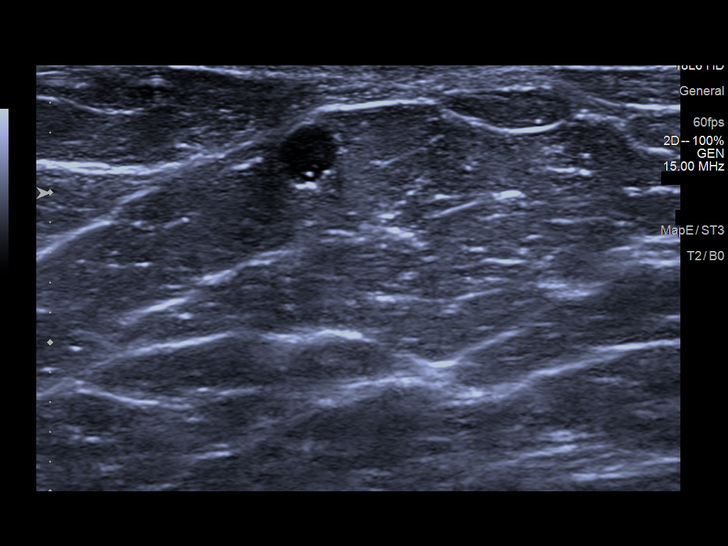
[im 2/13]
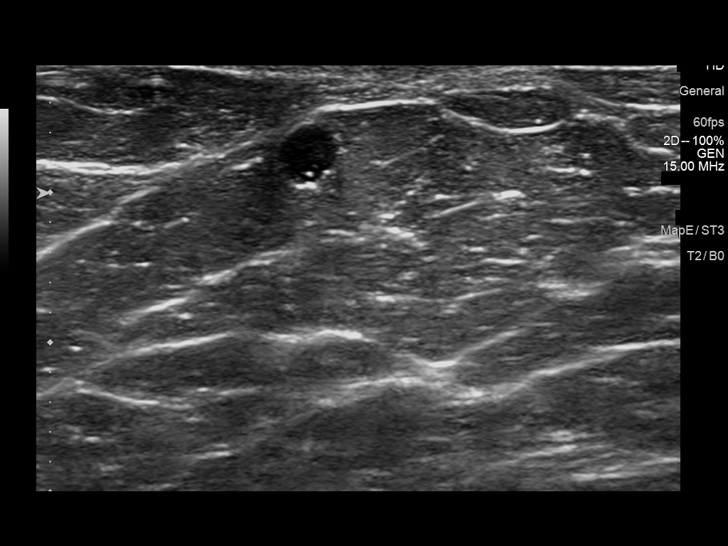
[im 3/13]
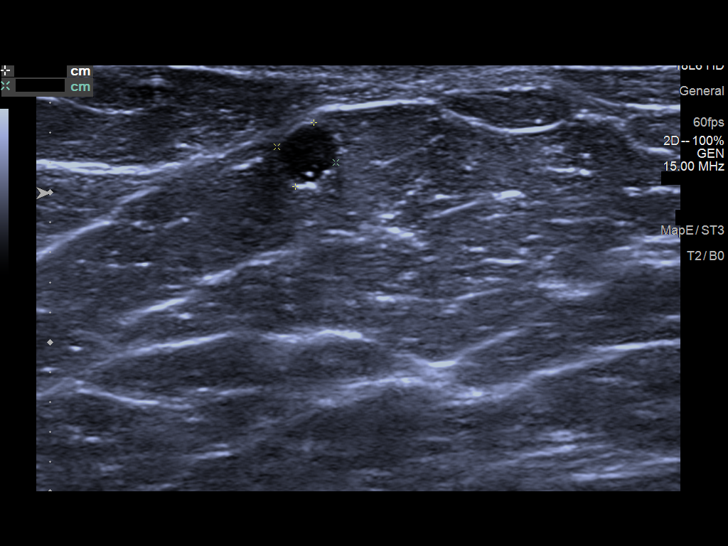
[im 4/13]
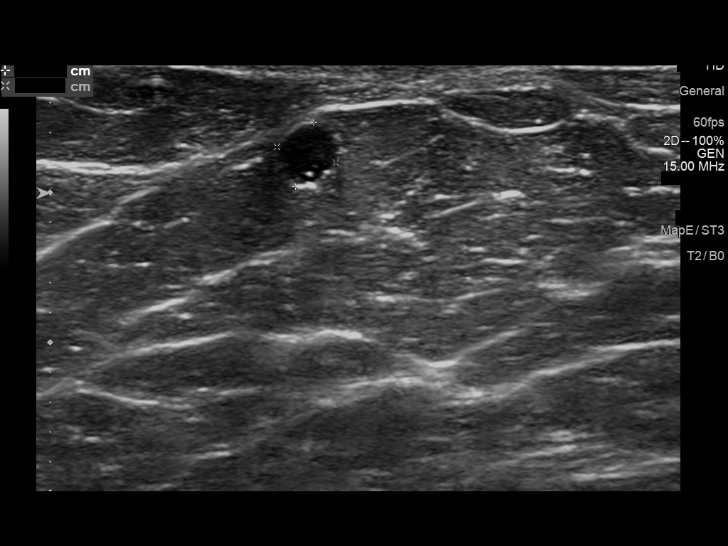
[im 5/13]
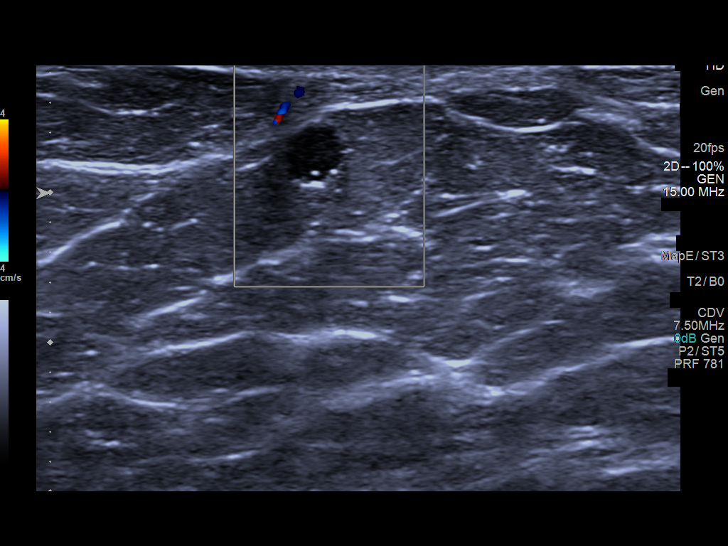
[im 6/13]
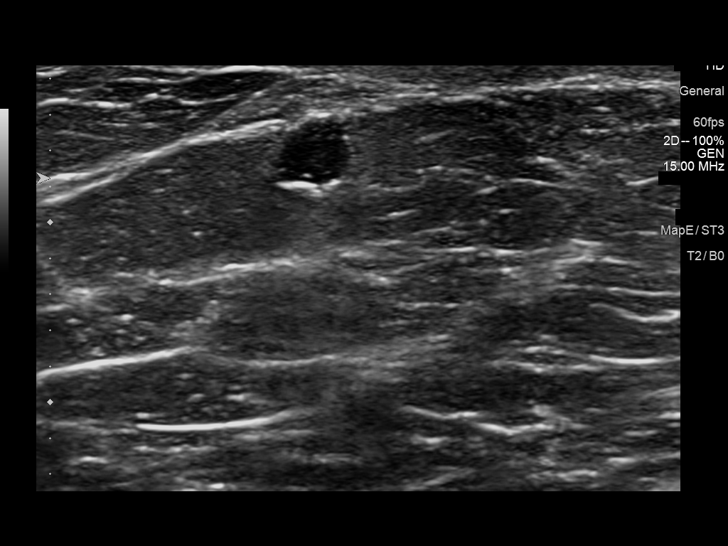
[im 7/13]
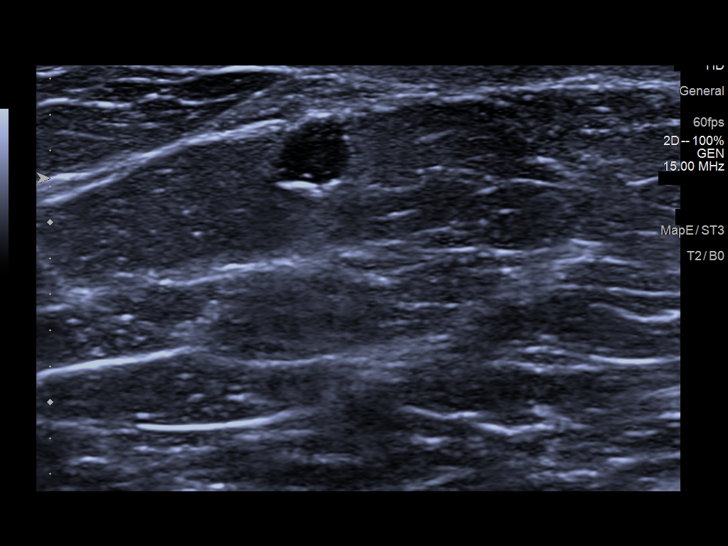
[im 8/13]
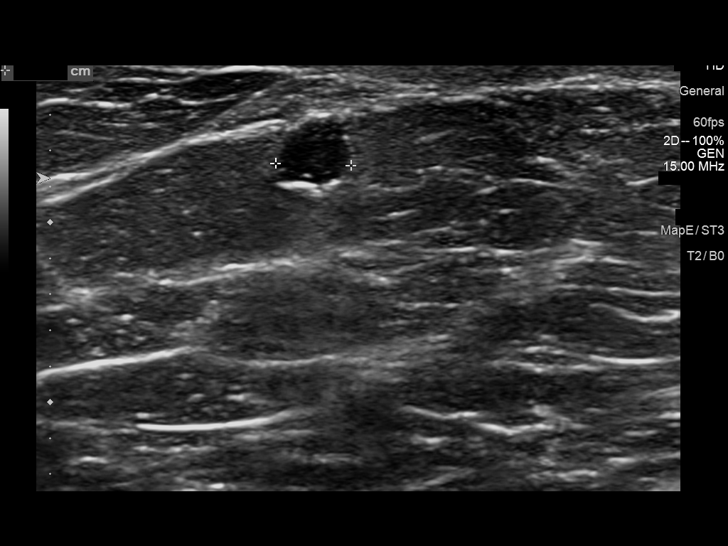
[im 9/13]
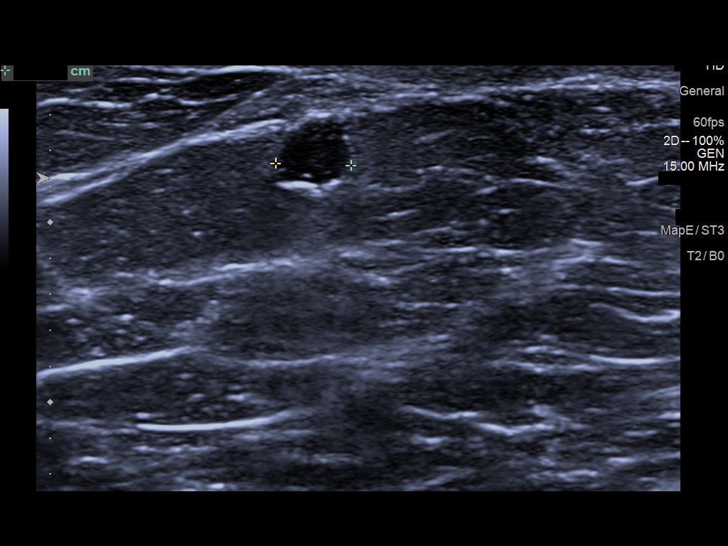
[im 10/13]
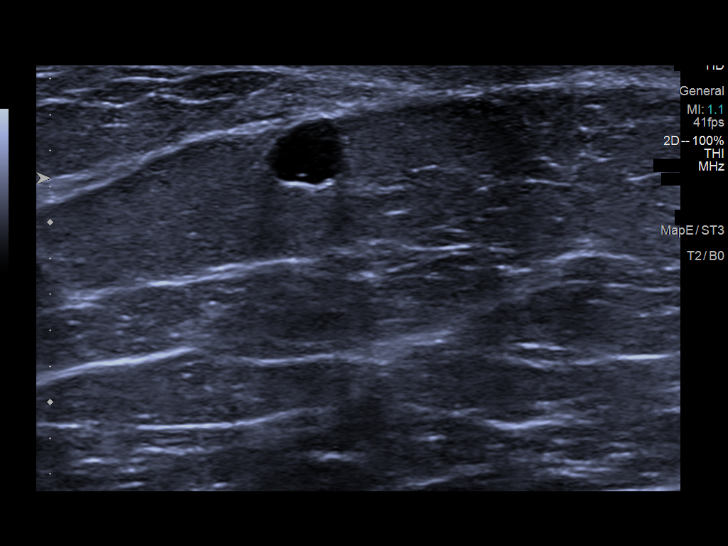
[im 11/13]
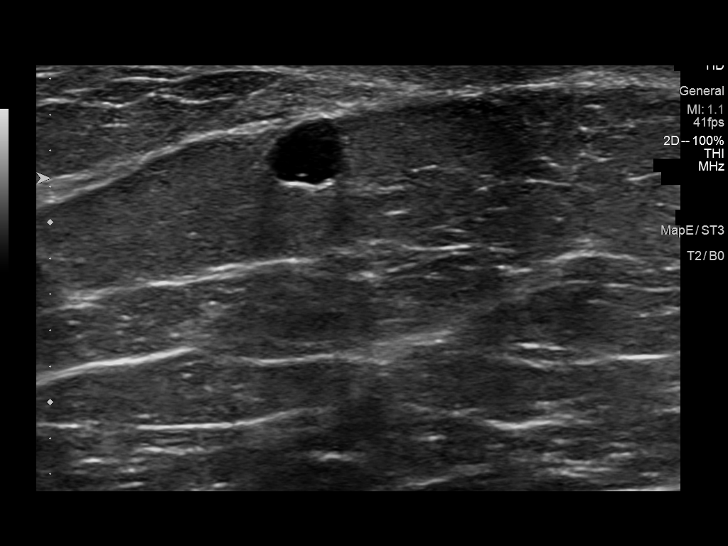
[im 12/13]
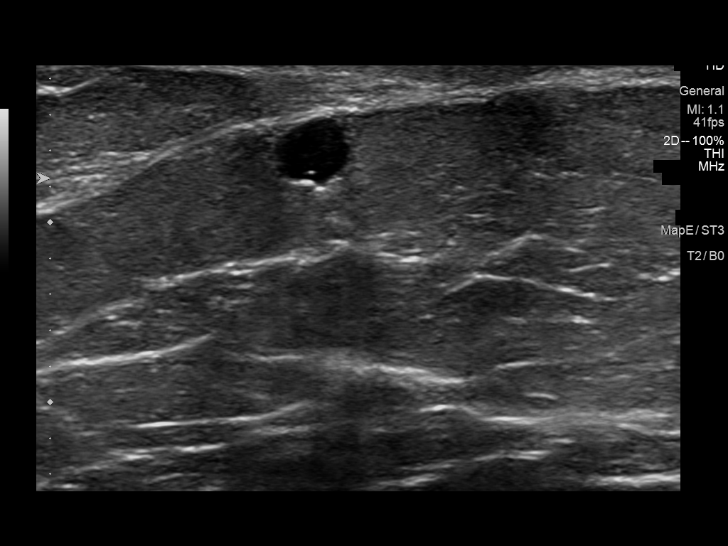
[im 13/13]
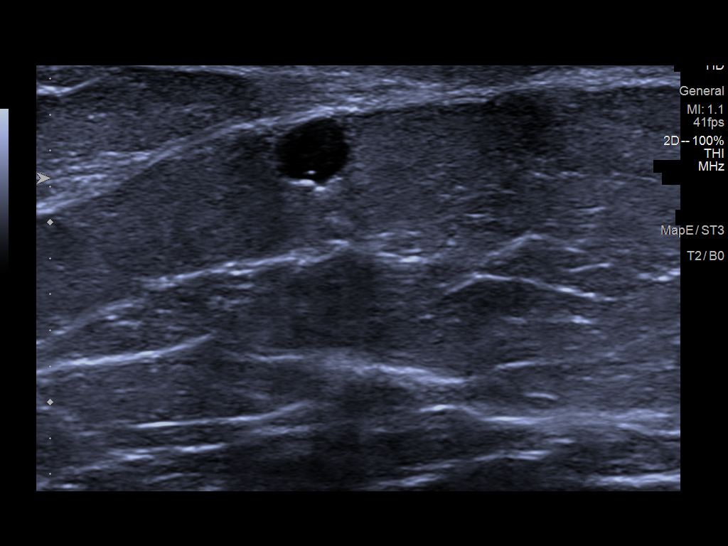

[13 of 13 positions shown; findings below may reference images not displayed]

ACR Breast Density Category b: There are scattered areas of
fibroglandular density.
FINDINGS: Additional imaging of the left breast was performed. There is
persistence of a well-circumscribed 5 mm mass in the subareolar
region of the left breast.

Targeted ultrasound is performed, showing a near anechoic mass in
the left breast measuring 4 x 4 x 4 mm. It is felt to likely be a
benign cyst.
IMPRESSION: Probable benign cyst in the left breast.

RECOMMENDATION:
Short-term interval follow-up left breast ultrasound in 6 months is
recommended.

I have discussed the findings and recommendations with the patient.
Results were also provided in writing at the conclusion of the
visit. If applicable, a reminder letter will be sent to the patient
regarding the next appointment.

BI-RADS CATEGORY  3: Probably benign.

## 2019-03-08 IMAGING — MG MM DIGITAL DIAGNOSTIC UNILAT*L* W/ TOMO W/ CAD
4 series · 4 of 12 positions shown · non-contrast
Comparison: Previous exam(s).

CLINICAL DATA: Patient was called back from screening mammogram for
a possible mass in the left breast.

EXAM:
DIGITAL DIAGNOSTIC LEFT MAMMOGRAM WITH TOMO
ULTRASOUND LEFT BREAST

[L MLO synth-2D]
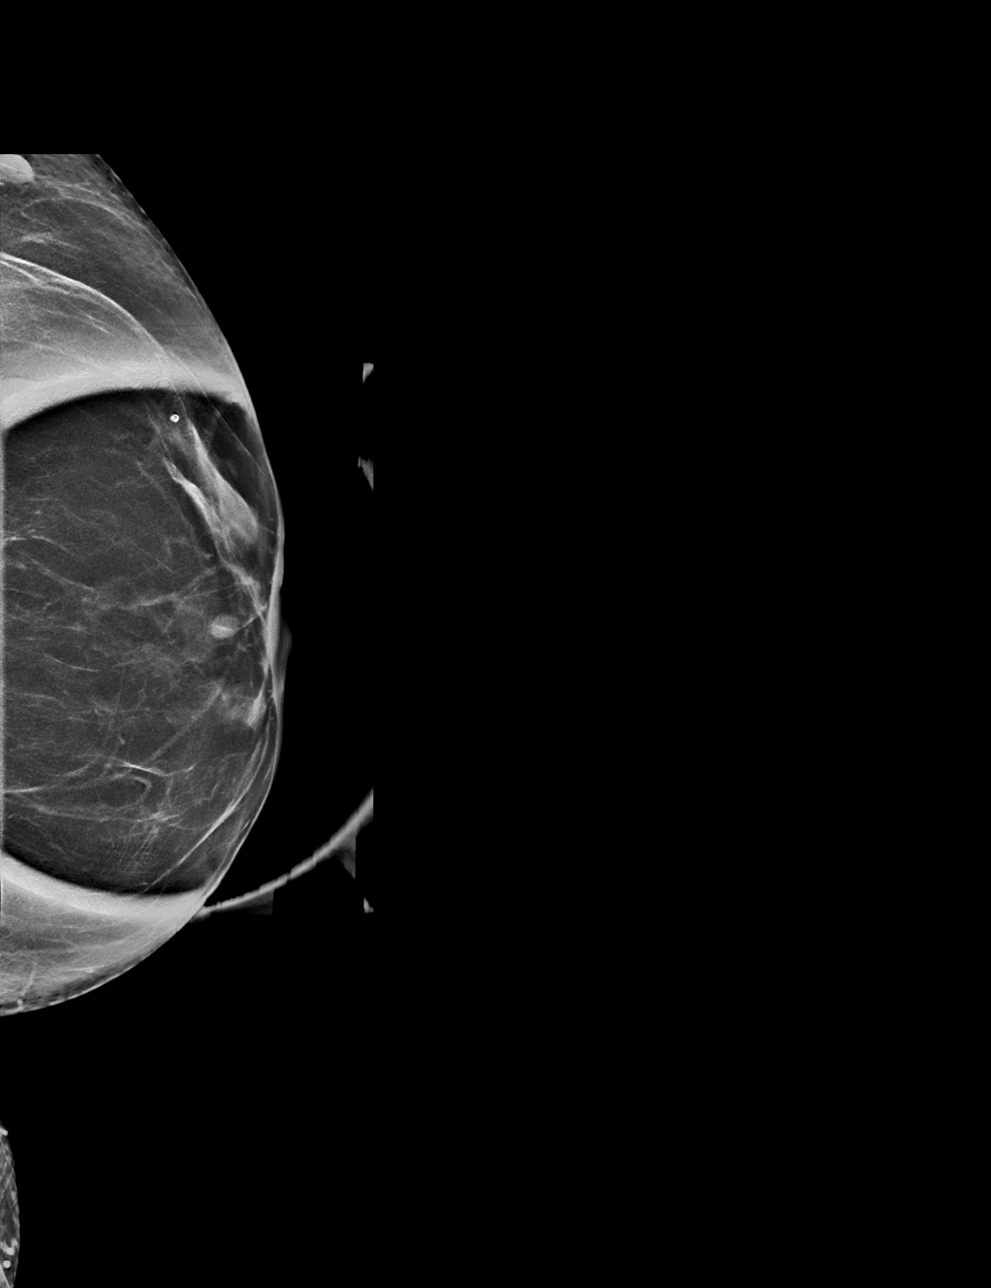

[L CC synth-2D]
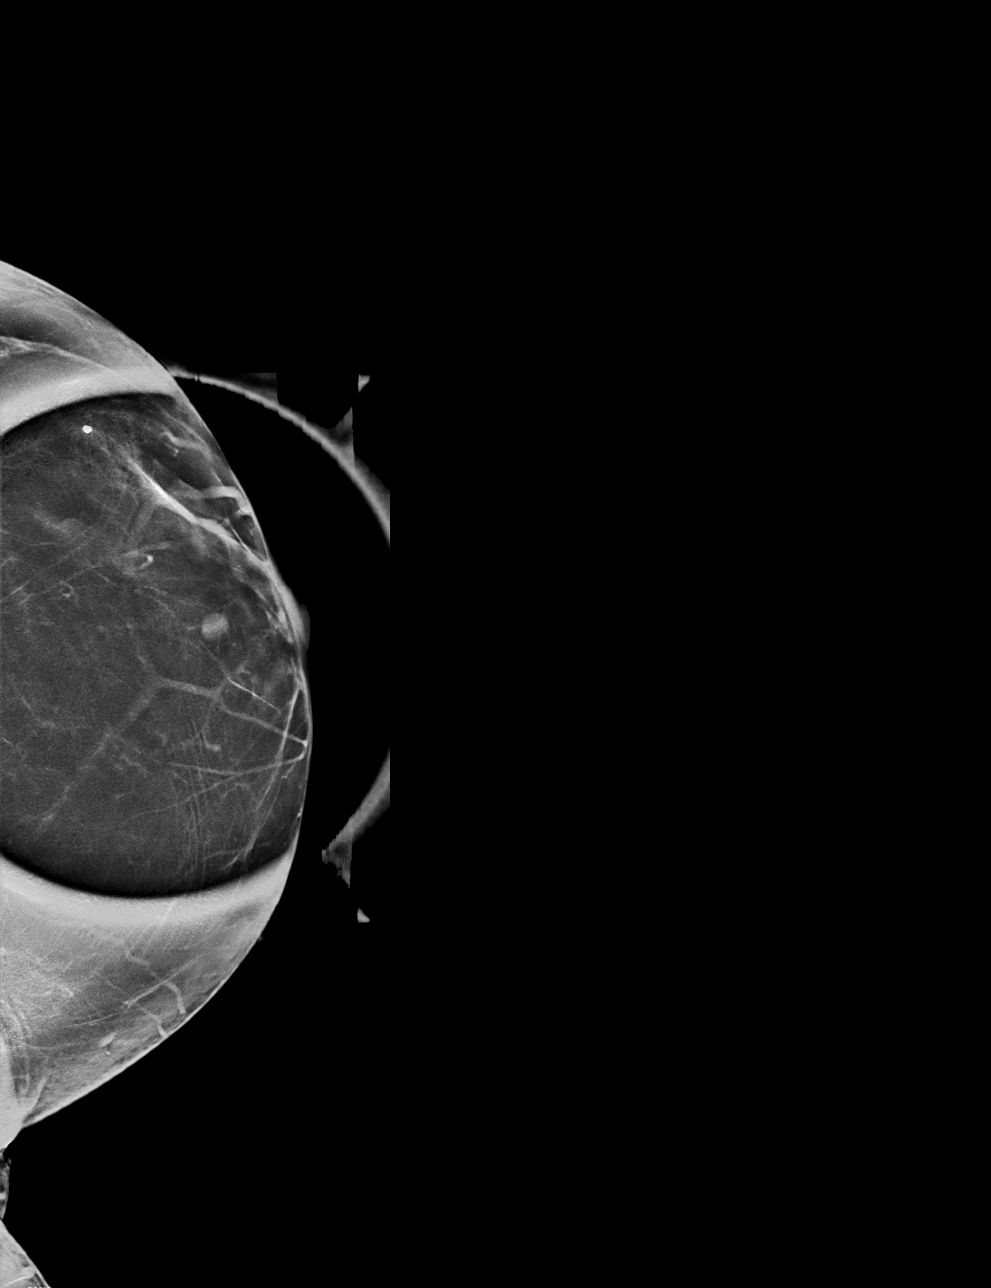

[L CC tomo · tomo slice 29/57.0]
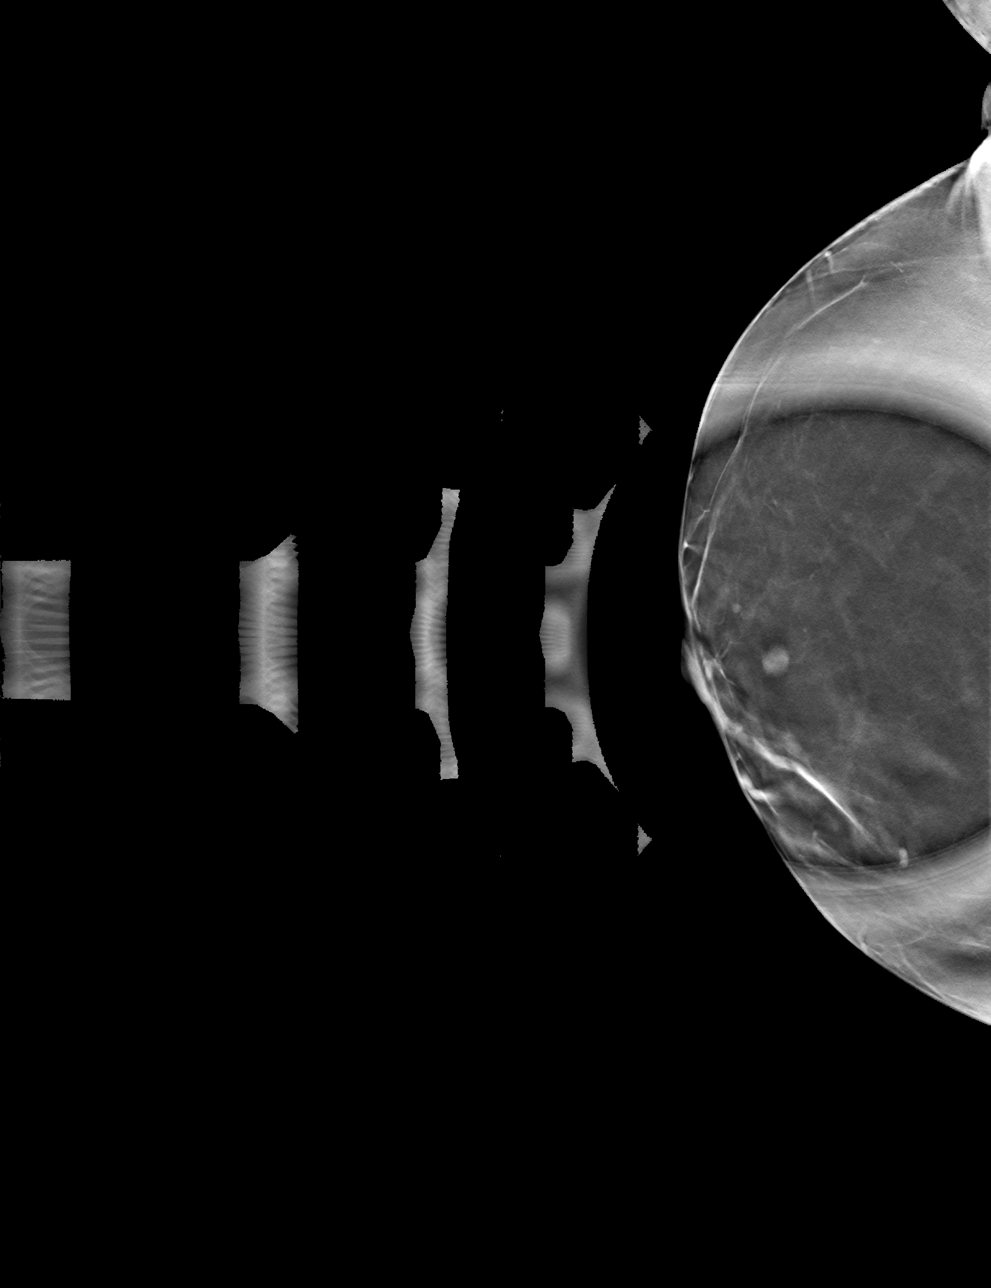

[L MLO tomo · tomo slice 30/59.0]
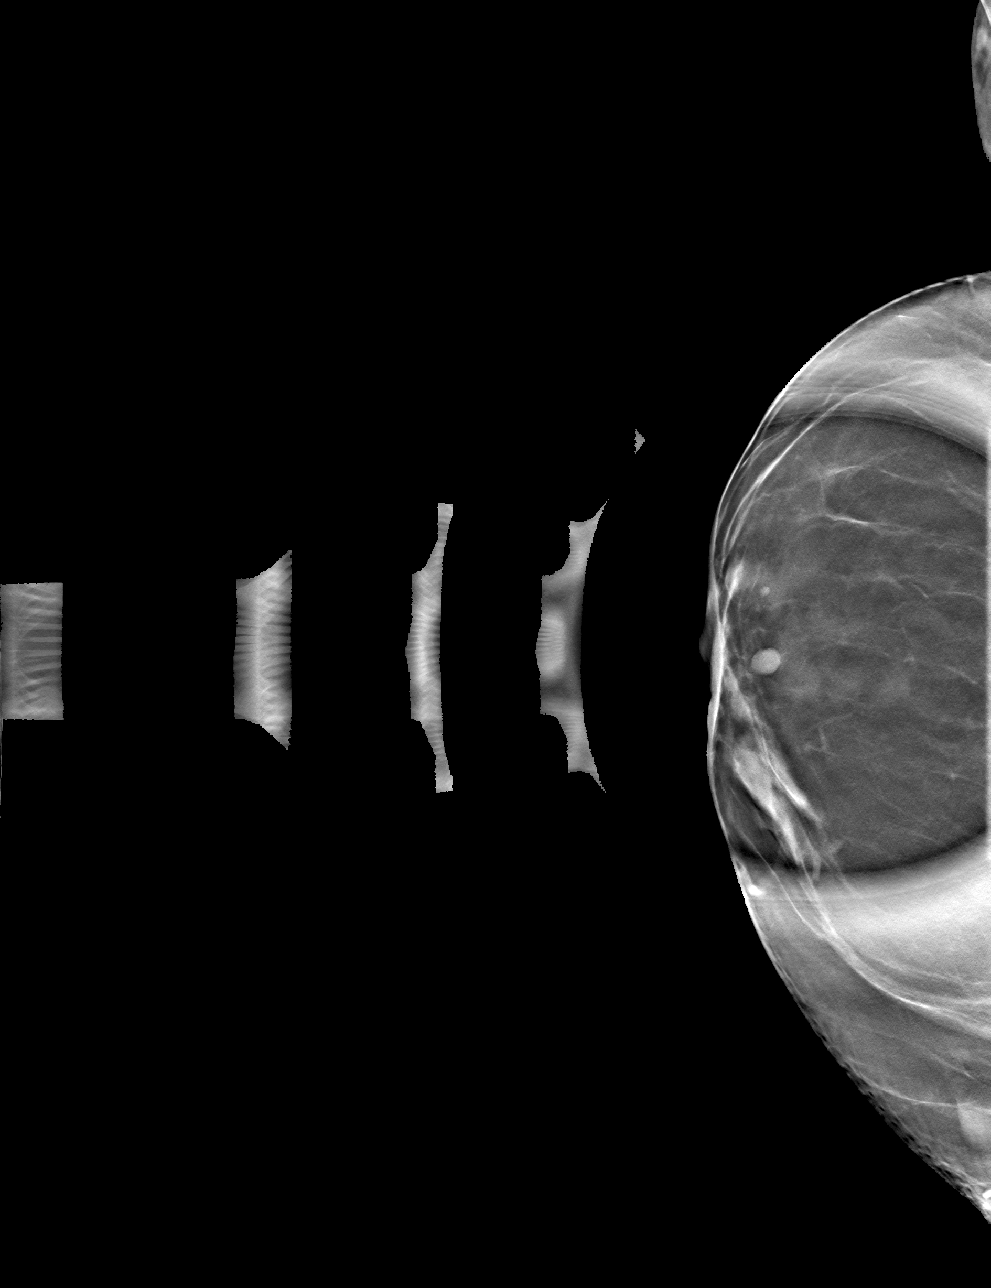

[4 of 12 positions shown; findings below may reference images not displayed]

ACR Breast Density Category b: There are scattered areas of
fibroglandular density.
FINDINGS: Additional imaging of the left breast was performed. There is
persistence of a well-circumscribed 5 mm mass in the subareolar
region of the left breast.

Targeted ultrasound is performed, showing a near anechoic mass in
the left breast measuring 4 x 4 x 4 mm. It is felt to likely be a
benign cyst.
IMPRESSION: Probable benign cyst in the left breast.

RECOMMENDATION:
Short-term interval follow-up left breast ultrasound in 6 months is
recommended.

I have discussed the findings and recommendations with the patient.
Results were also provided in writing at the conclusion of the
visit. If applicable, a reminder letter will be sent to the patient
regarding the next appointment.

BI-RADS CATEGORY  3: Probably benign.

## 2019-06-22 ENCOUNTER — Other Ambulatory Visit: Payer: Self-pay | Admitting: Internal Medicine

## 2019-06-22 DIAGNOSIS — N6002 Solitary cyst of left breast: Secondary | ICD-10-CM

## 2022-05-14 ENCOUNTER — Other Ambulatory Visit: Payer: Self-pay | Admitting: Nurse Practitioner

## 2022-05-14 DIAGNOSIS — R1900 Intra-abdominal and pelvic swelling, mass and lump, unspecified site: Secondary | ICD-10-CM

## 2022-08-16 ENCOUNTER — Ambulatory Visit: Admit: 2022-08-16 | Payer: Medicare Other

## 2022-08-16 SURGERY — COLONOSCOPY WITH PROPOFOL
Anesthesia: General
